# Patient Record
Sex: Male | Born: 1965 | Race: Black or African American | Hispanic: No | Marital: Married | Smoking: Never smoker
Health system: Southern US, Community
[De-identification: ages and names within clinical notes are randomized; demographics above are authoritative.]

## PROBLEM LIST (undated history)

## (undated) ENCOUNTER — Ambulatory Visit (HOSPITAL_COMMUNITY): Discharge status: Absent without leave | Payer: Self-pay

## (undated) DIAGNOSIS — I10 Essential (primary) hypertension: Secondary | ICD-10-CM

## (undated) DIAGNOSIS — E7800 Pure hypercholesterolemia, unspecified: Secondary | ICD-10-CM

## (undated) DIAGNOSIS — E119 Type 2 diabetes mellitus without complications: Secondary | ICD-10-CM

---

## 2000-05-15 ENCOUNTER — Ambulatory Visit (HOSPITAL_BASED_OUTPATIENT_CLINIC_OR_DEPARTMENT_OTHER): Admission: RE | Admit: 2000-05-15 | Discharge: 2000-05-15 | Payer: Self-pay | Admitting: Ophthalmology

## 2001-12-17 ENCOUNTER — Emergency Department (HOSPITAL_COMMUNITY): Admission: EM | Admit: 2001-12-17 | Discharge: 2001-12-17 | Payer: Self-pay | Admitting: Emergency Medicine

## 2002-02-22 ENCOUNTER — Encounter: Payer: Self-pay | Admitting: Emergency Medicine

## 2002-02-22 ENCOUNTER — Emergency Department (HOSPITAL_COMMUNITY): Admission: EM | Admit: 2002-02-22 | Discharge: 2002-02-22 | Payer: Self-pay | Admitting: Emergency Medicine

## 2003-01-18 ENCOUNTER — Emergency Department (HOSPITAL_COMMUNITY): Admission: EM | Admit: 2003-01-18 | Discharge: 2003-01-18 | Payer: Self-pay | Admitting: *Deleted

## 2003-07-01 ENCOUNTER — Emergency Department (HOSPITAL_COMMUNITY): Admission: EM | Admit: 2003-07-01 | Discharge: 2003-07-02 | Payer: Self-pay | Admitting: Emergency Medicine

## 2004-05-24 ENCOUNTER — Emergency Department (HOSPITAL_COMMUNITY): Admission: EM | Admit: 2004-05-24 | Discharge: 2004-05-24 | Payer: Self-pay | Admitting: Emergency Medicine

## 2004-06-13 ENCOUNTER — Emergency Department (HOSPITAL_COMMUNITY): Admission: EM | Admit: 2004-06-13 | Discharge: 2004-06-13 | Payer: Self-pay | Admitting: Emergency Medicine

## 2004-07-06 ENCOUNTER — Emergency Department (HOSPITAL_COMMUNITY): Admission: AD | Admit: 2004-07-06 | Discharge: 2004-07-06 | Payer: Self-pay | Admitting: Emergency Medicine

## 2004-10-18 ENCOUNTER — Emergency Department (HOSPITAL_COMMUNITY): Admission: EM | Admit: 2004-10-18 | Discharge: 2004-10-18 | Payer: Self-pay | Admitting: Family Medicine

## 2005-03-19 ENCOUNTER — Emergency Department (HOSPITAL_COMMUNITY): Admission: EM | Admit: 2005-03-19 | Discharge: 2005-03-19 | Payer: Self-pay | Admitting: Family Medicine

## 2005-06-28 ENCOUNTER — Emergency Department (HOSPITAL_COMMUNITY): Admission: EM | Admit: 2005-06-28 | Discharge: 2005-06-28 | Payer: Self-pay | Admitting: Family Medicine

## 2005-10-15 ENCOUNTER — Emergency Department (HOSPITAL_COMMUNITY): Admission: EM | Admit: 2005-10-15 | Discharge: 2005-10-15 | Payer: Self-pay | Admitting: Family Medicine

## 2005-11-16 ENCOUNTER — Emergency Department (HOSPITAL_COMMUNITY): Admission: EM | Admit: 2005-11-16 | Discharge: 2005-11-16 | Payer: Self-pay | Admitting: Family Medicine

## 2006-01-31 ENCOUNTER — Emergency Department (HOSPITAL_COMMUNITY): Admission: AD | Admit: 2006-01-31 | Discharge: 2006-01-31 | Payer: Self-pay | Admitting: Emergency Medicine

## 2006-02-03 ENCOUNTER — Emergency Department (HOSPITAL_COMMUNITY): Admission: EM | Admit: 2006-02-03 | Discharge: 2006-02-03 | Payer: Self-pay | Admitting: Family Medicine

## 2006-03-25 ENCOUNTER — Emergency Department (HOSPITAL_COMMUNITY): Admission: EM | Admit: 2006-03-25 | Discharge: 2006-03-25 | Payer: Self-pay | Admitting: Family Medicine

## 2006-06-27 ENCOUNTER — Emergency Department (HOSPITAL_COMMUNITY): Admission: EM | Admit: 2006-06-27 | Discharge: 2006-06-27 | Payer: Self-pay | Admitting: Family Medicine

## 2006-06-29 ENCOUNTER — Emergency Department (HOSPITAL_COMMUNITY): Admission: EM | Admit: 2006-06-29 | Discharge: 2006-06-29 | Payer: Self-pay | Admitting: Emergency Medicine

## 2006-12-13 ENCOUNTER — Emergency Department (HOSPITAL_COMMUNITY): Admission: EM | Admit: 2006-12-13 | Discharge: 2006-12-13 | Payer: Self-pay | Admitting: Family Medicine

## 2006-12-16 ENCOUNTER — Emergency Department (HOSPITAL_COMMUNITY): Admission: EM | Admit: 2006-12-16 | Discharge: 2006-12-16 | Payer: Self-pay | Admitting: Emergency Medicine

## 2007-03-05 ENCOUNTER — Emergency Department (HOSPITAL_COMMUNITY): Admission: EM | Admit: 2007-03-05 | Discharge: 2007-03-05 | Payer: Self-pay | Admitting: Family Medicine

## 2007-05-13 ENCOUNTER — Emergency Department (HOSPITAL_COMMUNITY): Admission: EM | Admit: 2007-05-13 | Discharge: 2007-05-13 | Payer: Self-pay | Admitting: Emergency Medicine

## 2007-05-31 ENCOUNTER — Emergency Department (HOSPITAL_COMMUNITY): Admission: EM | Admit: 2007-05-31 | Discharge: 2007-05-31 | Payer: Self-pay | Admitting: Emergency Medicine

## 2007-09-03 ENCOUNTER — Emergency Department (HOSPITAL_COMMUNITY): Admission: EM | Admit: 2007-09-03 | Discharge: 2007-09-03 | Payer: Self-pay | Admitting: Emergency Medicine

## 2007-11-17 ENCOUNTER — Emergency Department (HOSPITAL_COMMUNITY): Admission: EM | Admit: 2007-11-17 | Discharge: 2007-11-17 | Payer: Self-pay | Admitting: Family Medicine

## 2008-02-07 ENCOUNTER — Emergency Department (HOSPITAL_COMMUNITY): Admission: EM | Admit: 2008-02-07 | Discharge: 2008-02-07 | Payer: Self-pay | Admitting: Emergency Medicine

## 2008-02-21 ENCOUNTER — Emergency Department (HOSPITAL_COMMUNITY): Admission: EM | Admit: 2008-02-21 | Discharge: 2008-02-21 | Payer: Self-pay | Admitting: Family Medicine

## 2008-04-17 ENCOUNTER — Emergency Department (HOSPITAL_COMMUNITY): Admission: EM | Admit: 2008-04-17 | Discharge: 2008-04-17 | Payer: Self-pay | Admitting: Family Medicine

## 2008-07-31 ENCOUNTER — Emergency Department (HOSPITAL_COMMUNITY): Admission: EM | Admit: 2008-07-31 | Discharge: 2008-08-01 | Payer: Self-pay | Admitting: Emergency Medicine

## 2008-09-18 ENCOUNTER — Emergency Department (HOSPITAL_COMMUNITY): Admission: EM | Admit: 2008-09-18 | Discharge: 2008-09-18 | Payer: Self-pay | Admitting: Emergency Medicine

## 2008-12-04 ENCOUNTER — Emergency Department (HOSPITAL_COMMUNITY): Admission: EM | Admit: 2008-12-04 | Discharge: 2008-12-04 | Payer: Self-pay | Admitting: Emergency Medicine

## 2009-04-04 ENCOUNTER — Emergency Department (HOSPITAL_COMMUNITY): Admission: EM | Admit: 2009-04-04 | Discharge: 2009-04-04 | Payer: Self-pay | Admitting: Emergency Medicine

## 2009-07-30 ENCOUNTER — Emergency Department (HOSPITAL_COMMUNITY): Admission: EM | Admit: 2009-07-30 | Discharge: 2009-07-30 | Payer: Self-pay | Admitting: Family Medicine

## 2009-09-13 ENCOUNTER — Emergency Department (HOSPITAL_COMMUNITY): Admission: EM | Admit: 2009-09-13 | Discharge: 2009-09-13 | Payer: Self-pay | Admitting: Emergency Medicine

## 2009-12-01 ENCOUNTER — Emergency Department (HOSPITAL_COMMUNITY): Admission: EM | Admit: 2009-12-01 | Discharge: 2009-12-01 | Payer: Self-pay | Admitting: Family Medicine

## 2009-12-10 ENCOUNTER — Emergency Department (HOSPITAL_COMMUNITY): Admission: EM | Admit: 2009-12-10 | Discharge: 2009-12-10 | Payer: Self-pay | Admitting: Emergency Medicine

## 2010-01-03 ENCOUNTER — Emergency Department (HOSPITAL_COMMUNITY): Admission: EM | Admit: 2010-01-03 | Discharge: 2010-01-03 | Payer: Self-pay | Admitting: Emergency Medicine

## 2010-02-24 ENCOUNTER — Emergency Department (HOSPITAL_COMMUNITY): Admission: EM | Admit: 2010-02-24 | Discharge: 2010-02-24 | Payer: Self-pay | Admitting: Emergency Medicine

## 2010-04-02 ENCOUNTER — Ambulatory Visit: Payer: Self-pay | Admitting: Internal Medicine

## 2010-04-02 ENCOUNTER — Encounter (INDEPENDENT_AMBULATORY_CARE_PROVIDER_SITE_OTHER): Payer: Self-pay | Admitting: Family Medicine

## 2010-04-02 LAB — CONVERTED CEMR LAB
ALT: 35 units/L (ref 0–53)
Basophils Absolute: 0 10*3/uL (ref 0.0–0.1)
Basophils Relative: 0 % (ref 0–1)
Calcium: 10.1 mg/dL (ref 8.4–10.5)
Chloride: 96 meq/L (ref 96–112)
Eosinophils Absolute: 0 10*3/uL (ref 0.0–0.7)
Eosinophils Relative: 1 % (ref 0–5)
HCT: 44.5 % (ref 39.0–52.0)
Lymphocytes Relative: 63 % — ABNORMAL HIGH (ref 12–46)
Lymphs Abs: 3.2 10*3/uL (ref 0.7–4.0)
MCV: 80.9 fL (ref 78.0–100.0)
Monocytes Absolute: 0.3 10*3/uL (ref 0.1–1.0)
Neutro Abs: 1.6 10*3/uL — ABNORMAL LOW (ref 1.7–7.7)
Sodium: 133 meq/L — ABNORMAL LOW (ref 135–145)
Testosterone: 224.24 ng/dL — ABNORMAL LOW (ref 350–890)
Total Bilirubin: 0.3 mg/dL (ref 0.3–1.2)
Total Protein: 8.2 g/dL (ref 6.0–8.3)

## 2010-04-03 ENCOUNTER — Encounter (INDEPENDENT_AMBULATORY_CARE_PROVIDER_SITE_OTHER): Payer: Self-pay | Admitting: Family Medicine

## 2010-05-08 ENCOUNTER — Ambulatory Visit: Payer: Self-pay | Admitting: Internal Medicine

## 2010-08-04 ENCOUNTER — Emergency Department (HOSPITAL_COMMUNITY)
Admission: EM | Admit: 2010-08-04 | Discharge: 2010-08-05 | Payer: Self-pay | Source: Home / Self Care | Admitting: Emergency Medicine

## 2010-12-13 ENCOUNTER — Encounter (INDEPENDENT_AMBULATORY_CARE_PROVIDER_SITE_OTHER): Payer: Self-pay | Admitting: *Deleted

## 2010-12-13 LAB — CONVERTED CEMR LAB: Microalb, Ur: 1.61 mg/dL (ref 0.00–1.89)

## 2011-01-01 LAB — POCT I-STAT, CHEM 8
Chloride: 103 mEq/L (ref 96–112)
Glucose, Bld: 155 mg/dL — ABNORMAL HIGH (ref 70–99)
HCT: 43 % (ref 39.0–52.0)
Hemoglobin: 14.6 g/dL (ref 13.0–17.0)
Sodium: 140 mEq/L (ref 135–145)

## 2011-01-10 LAB — URINALYSIS, ROUTINE W REFLEX MICROSCOPIC
Bilirubin Urine: NEGATIVE
Hgb urine dipstick: NEGATIVE
Ketones, ur: NEGATIVE mg/dL
Protein, ur: NEGATIVE mg/dL
Specific Gravity, Urine: 1.009 (ref 1.005–1.030)
pH: 5.5 (ref 5.0–8.0)

## 2011-01-10 LAB — GLUCOSE, CAPILLARY: Glucose-Capillary: 205 mg/dL — ABNORMAL HIGH (ref 70–99)

## 2011-01-23 LAB — GLUCOSE, CAPILLARY: Glucose-Capillary: 354 mg/dL — ABNORMAL HIGH (ref 70–99)

## 2011-02-04 LAB — GLUCOSE, CAPILLARY: Glucose-Capillary: 103 mg/dL — ABNORMAL HIGH (ref 70–99)

## 2011-02-09 ENCOUNTER — Inpatient Hospital Stay (INDEPENDENT_AMBULATORY_CARE_PROVIDER_SITE_OTHER)
Admission: RE | Admit: 2011-02-09 | Discharge: 2011-02-09 | Disposition: A | Discharge status: Home / Self Care | Payer: Self-pay | Source: Ambulatory Visit | Attending: Family Medicine | Admitting: Family Medicine

## 2011-02-09 DIAGNOSIS — R7989 Other specified abnormal findings of blood chemistry: Secondary | ICD-10-CM

## 2011-02-09 DIAGNOSIS — H9209 Otalgia, unspecified ear: Secondary | ICD-10-CM

## 2011-02-09 LAB — GLUCOSE, CAPILLARY: Glucose-Capillary: 268 mg/dL — ABNORMAL HIGH (ref 70–99)

## 2011-03-07 NOTE — Op Note (Signed)
[place]  Patient:    Chase Mccormick, Chase Mccormick                MRN: [id number] Proc. Date: 05/15/00 Adm. Date:  [id number] Attending:  Shara Blazing                           Operative Report  PREOPERATIVE DIAGNOSES: 1. Exotropia. 2. Optic atrophy, right eye.  POSTOPERATIVE DIAGNOSES: 1. Exotropia. 2. Optic atrophy, right eye.  PROCEDURES: 1. Right lateral rectus muscle recession, 8.0 mm. 2. Right medial rectus muscle resection, 7.0 mm.  SURGEON:  Pasty Spillers. Maple Hudson, M.D.  ANESTHESIA:  General (laryngeal mask).  COMPLICATIONS:  None.  DESCRIPTION OF PROCEDURE:  After routine preoperative evaluation, including informed consent, the patient was taken to the operating room, where he was identified by me.  General anesthesia was induced without difficulty, after placement of appropriate monitors.   The patient was prepped and draped in standard sterile fashion.  A lid speculum was placed in the right eye.  Through an inferotemporal fornix incision through conjunctiva and Tenons fascia, the right lateral rectus muscle was engaged on a series of muscle hooks, and carefully cleared of its surrounding fascial attachments.  The tendon was secured with a double-armed 6-0 Vicryl suture, with a double-locking bite at each border of the tendon.  The muscle was disinserted from the globe using Westcott scissors and was reattached to sclera a measured distance of 8.0 mm posterior to the unoperated insertion, using direct scleral passes in cross-swords fashion.   The suture ends were tied securely after the position of the muscle had been checked and found to be accurate.  Conjunctiva was closed with two interrupted 6-0 plain gut sutures.  Through an inferonasal fornix incision through conjunctiva and Tenons fascia, the right medial rectus muscle was engaged on a series of muscle hooks, and carefully cleared of its surrounding fascial attachments.  The  muscle was spread between two self-retaining hooks.  A 2 mm bite of the center of the muscle belly was taken at a measured distance of 7.0 mm posterior to the insertion and a secure knot was placed at this location.  Each pole suture was passed from the center of the muscle belly to the periphery, 7 mm posterior to and parallel to the insertion.  A double locking bite was placed at each border of the muscle.  A resection clamp was placed on the muscle just anterior to these sutures.  The muscle was disinserted from the globe using Westcott scissors.  Each pole suture was passed back through the original stump posteriorly to anteriorly through the periphery of the stump and then anteriorly to posteriorly near the center of the stump and then posteriorly to anteriorly through the center of the muscle belly, just posterior to the previously placed knots.  The muscle was drawn up to the level of the original insertion and all slack was removed from the sutures, which were tied securely.  The clamp was removed.  The portion of the muscle anterior to the sutures was carefully excised.  Conjunctiva was closed with a single interrupted 6-0 plain gut suture.  Tobradex ointment was placed in the eye. The patient was awakened without difficulty and taken to the recovery room in stable condition, having suffered no intraoperative or immediate postoperative complications. DD:  05/15/00 TD:  05/16/00 Job: [id number]

## 2011-07-10 LAB — INFLUENZA A AND B ANTIGEN (CONVERTED LAB): Inflenza A Ag: NEGATIVE

## 2011-07-21 LAB — BASIC METABOLIC PANEL
BUN: 9
Calcium: 9.4
Creatinine, Ser: 0.76
GFR calc non Af Amer: >60
Sodium: 135

## 2011-07-21 LAB — DIFFERENTIAL
Basophils Relative: 0
Eosinophils Absolute: 0
Eosinophils Relative: 1
Lymphs Abs: 2
Monocytes Absolute: 0.3
Monocytes Relative: 6
Neutrophils Relative %: 50

## 2011-07-21 LAB — CBC
HCT: 43.8
MCV: 81.8
WBC: 4.6

## 2011-09-08 ENCOUNTER — Encounter: Payer: Self-pay | Admitting: *Deleted

## 2011-09-08 ENCOUNTER — Emergency Department (INDEPENDENT_AMBULATORY_CARE_PROVIDER_SITE_OTHER)
Admission: EM | Admit: 2011-09-08 | Discharge: 2011-09-08 | Disposition: A | Discharge status: Home / Self Care | Payer: Self-pay | Source: Home / Self Care | Attending: Family Medicine | Admitting: Family Medicine

## 2011-09-08 DIAGNOSIS — H109 Unspecified conjunctivitis: Secondary | ICD-10-CM

## 2011-09-08 MED ORDER — TOBRAMYCIN 0.3 % OP SOLN: 1.0000 [drp] | OPHTHALMIC | Status: AC

## 2011-09-08 NOTE — ED Notes (Signed)
Patient given discharge instructions by Dr Artis Flock, expressed understanding and had all questions answered.  Pt given RX x 1

## 2011-09-08 NOTE — ED Provider Notes (Signed)
History     CSN: [id number] Arrival date & time: 09/08/2011  1:42 PM   First MD Initiated Contact with Patient 09/08/11 1409      Chief Complaint  Patient presents with  . Eye Problem    (Consider location/radiation/quality/duration/timing/severity/associated sxs/prior treatment) Patient is a 45 y.o. male presenting with eye problem. The history is provided by the patient.  Eye Problem  This is a new problem. The current episode started more than 1 week ago. The problem occurs every several days. The problem has not changed since onset.There is pain in the left eye. There was no injury mechanism. The pain is mild. There is no history of trauma to the eye. Pertinent negatives include no blurred vision, no decreased vision, no discharge, no double vision, no photophobia and no eye redness.    Past Medical History  Diagnosis Date  . Diabetes mellitus     History reviewed. No pertinent past surgical history.  Family History  Problem Relation Age of Onset  . Ulcers Mother     History  Substance Use Topics  . Smoking status: Never Smoker   . Smokeless tobacco: Not on file  . Alcohol Use: No      Review of Systems  Constitutional: Negative.   HENT: Negative.   Eyes: Negative for blurred vision, double vision, photophobia, discharge and redness.    Allergies  Review of patient's allergies indicates no known allergies.  Home Medications   Current Outpatient Rx  Name Route Sig Dispense Refill  . GLIPIZIDE 5 MG PO TABS Oral Take 5 mg by mouth 2 (two) times daily before a meal.      . METFORMIN HCL 500 MG PO TABS Oral Take 500 mg by mouth 2 (two) times daily with a meal.      . NIACIN-LOVASTATIN 1000-20 MG PO TB24 Oral Take 1 tablet by mouth at bedtime.        BP 137/90  Pulse 64  Temp(Src) 98.2 F (36.8 C) (Oral)  Resp 16  SpO2 99%  Physical Exam  Nursing note and vitals reviewed. Constitutional: He appears well-developed and well-nourished.  HENT:  Head:  Normocephalic.  Right Ear: External ear normal.  Left Ear: External ear normal.  Nose: Nose normal.  Mouth/Throat: Oropharynx is clear and moist.  Eyes: Conjunctivae and EOM are normal. Pupils are equal, round, and reactive to light. Right eye exhibits no discharge. Left eye exhibits discharge.  Fundoscopic exam:      The right eye shows no arteriolar narrowing, no AV nicking, no exudate and no hemorrhage.       The left eye shows no arteriolar narrowing, no AV nicking, no exudate and no hemorrhage.    ED Course  Procedures (including critical care time)  Labs Reviewed  GLUCOSE, CAPILLARY - Abnormal; Notable for the following:    Glucose-Capillary 238 (*)    All other components within normal limits   No results found.   No diagnosis found.    MDM          Barkley Bruns, MD 09/08/11 [phone]

## 2011-09-08 NOTE — ED Notes (Signed)
Pt  Reports  intermittant      l  Eye  Pain    X   sev months   denys   Any  specefic  Injury    Pt  Reports    occasonal      Drainage  From the  Affected  Eye but  Not  Now

## 2011-11-15 ENCOUNTER — Emergency Department (HOSPITAL_COMMUNITY)
Admission: EM | Admit: 2011-11-15 | Discharge: 2011-11-15 | Disposition: A | Discharge status: Home / Self Care | Payer: Self-pay | Attending: Emergency Medicine | Admitting: Emergency Medicine

## 2011-11-15 ENCOUNTER — Encounter (HOSPITAL_COMMUNITY): Payer: Self-pay | Admitting: Emergency Medicine

## 2011-11-15 ENCOUNTER — Emergency Department (HOSPITAL_COMMUNITY): Payer: Self-pay

## 2011-11-15 DIAGNOSIS — I10 Essential (primary) hypertension: Secondary | ICD-10-CM | POA: Insufficient documentation

## 2011-11-15 DIAGNOSIS — R1011 Right upper quadrant pain: Secondary | ICD-10-CM | POA: Insufficient documentation

## 2011-11-15 DIAGNOSIS — Z79899 Other long term (current) drug therapy: Secondary | ICD-10-CM | POA: Insufficient documentation

## 2011-11-15 DIAGNOSIS — R197 Diarrhea, unspecified: Secondary | ICD-10-CM | POA: Insufficient documentation

## 2011-11-15 DIAGNOSIS — K859 Acute pancreatitis without necrosis or infection, unspecified: Secondary | ICD-10-CM | POA: Insufficient documentation

## 2011-11-15 DIAGNOSIS — E7800 Pure hypercholesterolemia, unspecified: Secondary | ICD-10-CM | POA: Insufficient documentation

## 2011-11-15 DIAGNOSIS — E119 Type 2 diabetes mellitus without complications: Secondary | ICD-10-CM | POA: Insufficient documentation

## 2011-11-15 DIAGNOSIS — R509 Fever, unspecified: Secondary | ICD-10-CM | POA: Insufficient documentation

## 2011-11-15 DIAGNOSIS — R110 Nausea: Secondary | ICD-10-CM | POA: Insufficient documentation

## 2011-11-15 HISTORY — DX: Essential (primary) hypertension: I10

## 2011-11-15 HISTORY — DX: Pure hypercholesterolemia, unspecified: E78.00

## 2011-11-15 LAB — COMPREHENSIVE METABOLIC PANEL
ALT: 47 U/L (ref 0–53)
Alkaline Phosphatase: 92 U/L (ref 39–117)
BUN: 10 mg/dL (ref 6–23)
CO2: 23 mEq/L (ref 19–32)
Calcium: 8.9 mg/dL (ref 8.4–10.5)
GFR calc Af Amer: >90 mL/min (ref >90)
GFR calc non Af Amer: >90 mL/min (ref >90)
Glucose, Bld: 256 mg/dL — ABNORMAL HIGH (ref 70–99)
Total Protein: 7.3 g/dL (ref 6.0–8.3)

## 2011-11-15 LAB — LIPASE, BLOOD: Lipase: 214 U/L — ABNORMAL HIGH (ref 11–59)

## 2011-11-15 LAB — CBC
HCT: 41.5 % (ref 39.0–52.0)
Hemoglobin: 14.6 g/dL (ref 13.0–17.0)
MCH: 28.3 pg (ref 26.0–34.0)
MCHC: 35.2 g/dL (ref 30.0–36.0)
MCV: 80.4 fL (ref 78.0–100.0)
RBC: 5.16 MIL/uL (ref 4.22–5.81)

## 2011-11-15 LAB — DIFFERENTIAL
Basophils Absolute: 0 10*3/uL (ref 0.0–0.1)
Basophils Relative: 0 % (ref 0–1)
Eosinophils Relative: 2 % (ref 0–5)
Lymphocytes Relative: 23 % (ref 12–46)
Monocytes Absolute: 0.5 10*3/uL (ref 0.1–1.0)
Neutro Abs: 4.1 10*3/uL (ref 1.7–7.7)

## 2011-11-15 MED ORDER — HYDROCODONE-ACETAMINOPHEN 5-325 MG PO TABS: 1.0000 | ORAL_TABLET | ORAL | Status: AC | PRN

## 2011-11-15 MED ORDER — FAMOTIDINE 10 MG PO TABS: 10.0000 mg | ORAL_TABLET | Freq: Two times a day (BID) | ORAL | Status: DC

## 2011-11-15 MED ORDER — PROMETHAZINE HCL 25 MG PO TABS: 25.0000 mg | ORAL_TABLET | Freq: Four times a day (QID) | ORAL | Status: AC | PRN

## 2011-11-15 NOTE — ED Notes (Signed)
PO fluids given to patient for PO challenge.

## 2011-11-15 NOTE — ED Provider Notes (Signed)
Medical screening examination/treatment/procedure(s) were performed by non-physician practitioner and as supervising physician I was immediately available for consultation/collaboration.   Lyanne Co, MD 11/15/11 [phone]

## 2011-11-15 NOTE — ED Provider Notes (Signed)
History     CSN: [id number]  Arrival date & time 11/15/11  1229   First MD Initiated Contact with Patient 11/15/11 1253      Chief Complaint  Patient presents with  . Abdominal Pain  . Fever    (Consider location/radiation/quality/duration/timing/severity/associated sxs/prior treatment) HPI History provided by pt.   Pt has had post-prandial, diffuse abdominal pain, worst in RUQ, w/ associated nausea, diarrhea and subjective fever for the past three days.  Non-positional. Denies CP, SOB, blood in stool, urinary sx.  No past abdominal surgeries.  Has not had alcohol in 6 months.  Has been eating a lot of spicy food over the past few days.    Past Medical History  Diagnosis Date  . Diabetes mellitus   . Hypertension   . High cholesterol     History reviewed. No pertinent past surgical history.  Family History  Problem Relation Age of Onset  . Ulcers Mother     History  Substance Use Topics  . Smoking status: Never Smoker   . Smokeless tobacco: Not on file  . Alcohol Use: No      Review of Systems  All other systems reviewed and are negative.    Allergies  Review of patient's allergies indicates no known allergies.  Home Medications   Current Outpatient Rx  Name Route Sig Dispense Refill  . ATORVASTATIN CALCIUM 20 MG PO TABS Oral Take 20 mg by mouth at bedtime.    Marland Kitchen GLIPIZIDE 5 MG PO TABS Oral Take 5 mg by mouth 2 (two) times daily before a meal.      . LISINOPRIL 10 MG PO TABS Oral Take 10 mg by mouth daily.    Marland Kitchen METFORMIN HCL 500 MG PO TABS Oral Take 500 mg by mouth 2 (two) times daily with a meal.      . NIACIN-LOVASTATIN ER 1000-20 MG PO TB24 Oral Take 1 tablet by mouth at bedtime.        BP 129/74  Pulse 77  Temp(Src) 97.6 F (36.4 C) (Oral)  Resp 18  SpO2 95%  Physical Exam  Nursing note and vitals reviewed. Constitutional: He is oriented to person, place, and time. He appears well-developed and well-nourished. No distress.  HENT:  Head:  Normocephalic and atraumatic.  Eyes:       Normal appearance  Neck: Normal range of motion.  Cardiovascular: Normal rate and regular rhythm.   Pulmonary/Chest: Effort normal and breath sounds normal. He exhibits no tenderness.  Abdominal: Soft. Bowel sounds are normal. He exhibits no distension and no mass. There is no rebound and no guarding.       Mild tenderness across upper abdomen.    Neurological: He is alert and oriented to person, place, and time.  Skin: Skin is warm and dry. No rash noted.  Psychiatric: He has a normal mood and affect. His behavior is normal.    ED Course  Procedures (including critical care time)  Labs Reviewed  COMPREHENSIVE METABOLIC PANEL - Abnormal; Notable for the following:    Glucose, Bld 256 (*)    Creatinine, Ser 0.48 (*)    All other components within normal limits  LIPASE, BLOOD - Abnormal; Notable for the following:    Lipase 214 (*)    All other components within normal limits  CBC  DIFFERENTIAL  DIFFERENTIAL  URINALYSIS, ROUTINE W REFLEX MICROSCOPIC   US Abdomen Complete  11/15/2011  *RADIOLOGY REPORT*  Clinical Data:  Abdominal pain.  Elevated lipase.  ABDOMINAL ULTRASOUND  COMPLETE  Comparison:  None.  Findings: Diminished exam detail due to overlying bowel gas.  Gallbladder:  No gallstones, gallbladder wall thickening, or pericholecystic fluid.  Common Bile Duct:  Within normal limits in caliber.  Liver: Diffuse fatty infiltration of the liver.  No focal mass lesion identified.  IVC:  Appears normal.  Pancreas:  No abnormality identified.  Spleen:  Within normal limits in size and echotexture.  Right kidney:  Normal in size and parenchymal echogenicity.  No evidence of mass or hydronephrosis.  Left kidney:  Normal in size and parenchymal echogenicity.  No evidence of mass or hydronephrosis.  Abdominal Aorta:  No aneurysm identified.  IMPRESSION: 1.  No acute findings. 2. Fatty infiltration of the liver.  Original Report Authenticated By: Rosealee Albee, M.D.     1. Pancreatitis       MDM  Pt presents w/ post-prandial abd pain/nausea as well as diarrhea and subjective fever x 3 days.  Exam sig for diffuse upper abd ttp; pt points to RUQ as being most painful location.  Has not had pain since attempting to eat breakfast this am and declines pain medication at this time.  Suspect cholelithiasis vs. gastritis.  EKG ordered d/t comorbidities and labs and [place] abd pending.  1:37 PM   Labs sig for hyperglycemia w/out acidosis/anion gap and elevated lipase.  [place] neg for gallstones.  Pt continues to be pain free and is tolerating po fluids.  I recommended liquid diet for the next 24-48 hours and f/u with both his PCP and GI.  Prescribed vicodin, promethazine and pepcid (possible gastritis as well).  Strict return precautions discussed.        Arie Sabina Bro[place]us, [place] 11/15/11 1539

## 2011-11-15 NOTE — ED Notes (Signed)
Reports having abd pain when he eats and now having fever. No acute distress noted at triage.

## 2011-11-15 NOTE — ED Notes (Signed)
Patient is resting comfortably. Patient declined need for pain medication at this time.

## 2012-01-18 ENCOUNTER — Emergency Department (HOSPITAL_COMMUNITY)
Admission: EM | Admit: 2012-01-18 | Discharge: 2012-01-18 | Discharge status: Against Medical Advice | Payer: Self-pay | Attending: Emergency Medicine | Admitting: Emergency Medicine

## 2012-01-18 ENCOUNTER — Encounter (HOSPITAL_COMMUNITY): Payer: Self-pay | Admitting: *Deleted

## 2012-01-18 DIAGNOSIS — I10 Essential (primary) hypertension: Secondary | ICD-10-CM | POA: Insufficient documentation

## 2012-01-18 DIAGNOSIS — L738 Other specified follicular disorders: Secondary | ICD-10-CM | POA: Insufficient documentation

## 2012-01-18 DIAGNOSIS — E7800 Pure hypercholesterolemia, unspecified: Secondary | ICD-10-CM | POA: Insufficient documentation

## 2012-01-18 DIAGNOSIS — L739 Follicular disorder, unspecified: Secondary | ICD-10-CM

## 2012-01-18 DIAGNOSIS — E119 Type 2 diabetes mellitus without complications: Secondary | ICD-10-CM | POA: Insufficient documentation

## 2012-01-18 LAB — GLUCOSE, CAPILLARY: Glucose-Capillary: 464 mg/dL — ABNORMAL HIGH (ref 70–99)

## 2012-01-18 MED ORDER — SULFAMETHOXAZOLE-TMP DS 800-160 MG PO TABS
1.0000 | ORAL_TABLET | Freq: Once | ORAL | Status: AC
  Administered 2012-01-18: 1 via ORAL
  Filled 2012-01-18: qty 1

## 2012-01-18 MED ORDER — SULFAMETHOXAZOLE-TMP DS 800-160 MG PO TABS: 1.0000 | ORAL_TABLET | Freq: Two times a day (BID) | ORAL | Status: AC

## 2012-01-18 NOTE — ED Provider Notes (Signed)
History     CSN: [id number]  Arrival date & time 01/18/12  1645   First MD Initiated Contact with Patient 01/18/12 1727      Chief Complaint  Patient presents with  . infected hair folicles     (Consider location/radiation/quality/duration/timing/severity/associated sxs/prior treatment) HPI Comments: Patient here with pustules to pubic area after having shaved his pubic hair 3-4 days ago - states no fever, chills, purulent drainage from the areas if he squeezes them.  Patient is a 46 y.o. male presenting with rash. The history is provided by the patient. No language interpreter was used.  Rash  This is a new problem. The current episode started more than 2 days ago. The problem has been gradually worsening. Associated with: shaving. There has been no fever. The rash is present on the groin. The pain is at a severity of 4/10. The pain is moderate. The pain has been constant since onset. Associated symptoms include itching and pain. Pertinent negatives include no blisters and no weeping. He has tried nothing for the symptoms.    Past Medical History  Diagnosis Date  . Diabetes mellitus   . Hypertension   . High cholesterol     History reviewed. No pertinent past surgical history.  Family History  Problem Relation Age of Onset  . Ulcers Mother     History  Substance Use Topics  . Smoking status: Never Smoker   . Smokeless tobacco: Not on file  . Alcohol Use: No      Review of Systems  Skin: Positive for itching and rash.  All other systems reviewed and are negative.    Allergies  Review of patient's allergies indicates no known allergies.  Home Medications  No current outpatient prescriptions on file.  BP 144/83  Pulse 81  Temp(Src) 97.9 F (36.6 C) (Oral)  Resp 20  SpO2 98%  Physical Exam  Nursing note and vitals reviewed. Constitutional: He is oriented to person, place, and time. He appears well-developed and well-nourished. No distress.  HENT:    Head: Normocephalic and atraumatic.  Right Ear: External ear normal.  Left Ear: External ear normal.  Nose: Nose normal.  Mouth/Throat: Oropharynx is clear and moist. No oropharyngeal exudate.  Eyes: Conjunctivae are normal. Pupils are equal, round, and reactive to light. No scleral icterus.  Neck: Normal range of motion. Neck supple.  Cardiovascular: Normal rate, regular rhythm and normal heart sounds.  Exam reveals no gallop and no friction rub.   No murmur heard. Pulmonary/Chest: Effort normal and breath sounds normal. He exhibits no tenderness.  Abdominal: Soft. Bowel sounds are normal. He exhibits no distension. There is no tenderness.  Musculoskeletal: Normal range of motion. He exhibits no edema and no tenderness.  Lymphadenopathy:    He has no cervical adenopathy.  Neurological: He is alert and oriented to person, place, and time. No cranial nerve deficit.  Skin: Skin is warm and dry. Rash noted. There is erythema.       Diffuse pustules to hair follicles to pubic area.  Psychiatric: He has a normal mood and affect. His behavior is normal. Judgment and thought content normal.    ED Course  Procedures (including critical care time)  Labs Reviewed  GLUCOSE, CAPILLARY - Abnormal; Notable for the following:    Glucose-Capillary 464 (*)    All other components within normal limits   No results found.   Folliculitis    MDM  Patient here with folliculitis - he asked me to check his blood  sugar which was noted to be quite high at 464 - I would like to do further testing and give the patient insulin to get the sugar down but he does not wish to stay.  He will sign out AMA for this - though possible, I do not clinically suspect that the patient is in DKA, will give him the prescription for the antibiotic for the folliculitis - he reports that he has diabetes medication at home and he will resume taking this.        Izola Price [place], [place] 01/18/12 [phone]

## 2012-01-18 NOTE — ED Notes (Signed)
Pt reports recently shaving his genitalia area and now has white bumps to the area.

## 2012-01-18 NOTE — Discharge Instructions (Signed)
Blood Sugar Monitoring, Adult GLUCOSE METERS FOR SELF-MONITORING OF BLOOD GLUCOSE  It is important to be able to correctly measure your blood sugar (glucose). You can use a blood glucose monitor (a small battery-operated device) to check your glucose level at any time. This allows you and your caregiver to monitor your diabetes and to determine how well your treatment plan is working. The process of monitoring your blood glucose with a glucose meter is called self-monitoring of blood glucose (SMBG). When people with diabetes control their blood sugar, they have better health. To test for glucose with a typical glucose meter, place the disposable strip in the meter. Then place a small sample of blood on the "test strip." The test strip is coated with chemicals that combine with glucose in blood. The meter measures how much glucose is present. The meter displays the glucose level as a number. Several new models can record and store a number of test results. Some models can connect to personal computers to store test results or print them out.  Newer meters are often easier to use than older models. Some meters allow you to get blood from places other than your fingertip. Some new models have automatic timing, error codes, signals, or barcode readers to help with proper adjustment (calibration). Some meters have a large display screen or spoken instructions for people with visual impairments.  INSTRUCTIONS FOR USING GLUCOSE METERS  Wash your hands with soap and warm water, or clean the area with alcohol. Dry your hands completely.   Prick the side of your fingertip with a lancet (a sharp-pointed tool used by hand).   Hold the hand down and gently milk the finger until a small drop of blood appears. Catch the blood with the test strip.   Follow the instructions for inserting the test strip and using the SMBG meter. Most meters require the meter to be turned on and the test strip to be inserted before  applying the blood sample.   Record the test result.   Read the instructions carefully for both the meter and the test strips that go with it. Meter instructions are found in the user manual. Keep this manual to help you solve any problems that may arise. Many meters use "error codes" when there is a problem with the meter, the test strip, or the blood sample on the strip. You will need the manual to understand these error codes and fix the problem.   New devices are available such as laser lancets and meters that can test blood taken from "alternative sites" of the body, other than fingertips. However, you should use standard fingertip testing if your glucose changes rapidly. Also, use standard testing if:   You have eaten, exercised, or taken insulin in the past 2 hours.   You think your glucose is low.   You tend to not feel symptoms of low blood glucose (hypoglycemia).   You are ill or under stress.   Clean the meter as directed by the manufacturer.   Test the meter for accuracy as directed by the manufacturer.   Take your meter with you to your caregiver's office. This way, you can test your glucose in front of your caregiver to make sure you are using the meter correctly. Your caregiver can also take a sample of blood to test using a routine lab method. If values on the glucose meter are close to the lab results, you and your caregiver will see that your meter is working well  and you are using good technique. Your caregiver will advise you about what to do if the results do not match.  FREQUENCY OF TESTING  Your caregiver will tell you how often you should check your blood glucose. This will depend on your type of diabetes, your current level of diabetes control, and your types of medicines. The following are general guidelines, but your care plan may be different. Record all your readings and the time of day you took them for review with your caregiver.   Diabetes type 1.   When you  are using insulin with good diabetic control (either multiple daily injections or via a pump), you should check your glucose 4 times a day.   If your diabetes is not well controlled, you may need to monitor more frequently, including before meals and 2 hours after meals, at bedtime, and occasionally between 2 a.m. and 3 a.m.   You should always check your glucose before a dose of insulin or before changing the rate on your insulin pump.   Diabetes type 2.   Guidelines for SMBG in diabetes type 2 are not as well defined.   If you are on insulin, follow the guidelines above.   If you are on medicines, but not insulin, and your glucose is not well controlled, you should test at least twice daily.   If you are not on insulin, and your diabetes is controlled with medicines or diet alone, you should test at least once daily, usually before breakfast.   A weekly profile will help your caregiver advise you on your care plan. The week before your visit, check your glucose before a meal and 2 hours after a meal at least daily. You may want to test before and after a different meal each day so you and your caregiver can tell how well controlled your blood sugars are throughout the course of a 24 hour period.   Gestational diabetes (diabetes during pregnancy).   Frequent testing is often necessary. Accurate timing is important.   If you are not on insulin, check your glucose 4 times a day. Check it before breakfast and 1 hour after the start of each meal.   If you are on insulin, check your glucose 6 times a day. Check it before each meal and 1 hour after the first bite of each meal.   General guidelines.   More frequent testing is required at the start of insulin treatment. Your caregiver will instruct you.   Test your glucose any time you suspect you have low blood sugar (hypoglycemia).   You should test more often when you change medicines, when you have unusual stress or illness, or in other  unusual circumstances.  OTHER THINGS TO KNOW ABOUT GLUCOSE METERS  Measurement Range. Most glucose meters are able to read glucose levels over a broad range of values from as low as 0 to as high as 600 mg/dL. If you get an extremely high or low reading from your meter, you should first confirm it with another reading. Report very high or very low readings to your caregiver.   Whole Blood Glucose versus Plasma Glucose. Some older home glucose meters measure glucose in your whole blood. In a lab or when using some newer home glucose meters, the glucose is measured in your plasma (one component of blood). The difference can be important. It is important for you and your caregiver to know whether your meter gives its results as "whole blood equivalent" or "plasma  equivalent."   Display of High and Low Glucose Values. Part of learning how to operate a meter is understanding what the meter results mean. Know how high and low glucose concentrations are displayed on your meter.   Factors that Affect Glucose Meter Performance. The accuracy of your test results depends on many factors and varies depending on the brand and type of meter. These factors include:   Low red blood cell count (anemia).   Substances in your blood (such as uric acid, vitamin C, and others).   Environmental factors (temperature, humidity, altitude).   Name-brand versus generic test strips.   Calibration. Make sure your meter is set up properly. It is a good idea to do a calibration test with a control solution recommended by the manufacturer of your meter whenever you begin using a fresh bottle of test strips. This will help verify the accuracy of your meter.   Improperly stored, expired, or defective test strips. Keep your strips in a dry place with the lid on.   Soiled meter.   Inadequate blood sample.  NEW TECHNOLOGIES FOR GLUCOSE TESTING Alternative site testing Some glucose meters allow testing blood from alternative  sites. These include the:  Upper arm.   Forearm.   Base of the thumb.   Thigh.  Sampling blood from alternative sites may be desirable. However, it may have some limitations. Blood in the fingertips show changes in glucose levels more quickly than blood in other parts of the body. This means that alternative site test results may be different from fingertip test results, not because of the meter's ability to test accurately, but because the actual glucose concentration can be different.  Continuous Glucose Monitoring Devices to measure your blood glucose continuously are available, and others are in development. These methods can be more expensive than self-monitoring with a glucose meter. However, it is uncertain how effective and reliable these devices are. Your caregiver will advise you if this approach makes sense for you. IF BLOOD SUGARS ARE CONTROLLED, PEOPLE WITH DIABETES REMAIN HEALTHIER.  SMBG is an important part of the treatment plan of patients with diabetes mellitus. Below are reasons for using SMBG:   It confirms that your glucose is at a specific, healthy level.   It detects hypoglycemia and severe hyperglycemia.   It allows you and your caregiver to make adjustments in response to changes in lifestyle for individuals requiring medicine.   It determines the need for starting insulin therapy in temporary diabetes that happens during pregnancy (gestational diabetes).  Document Released: 10/09/2003 Document Revised: 09/25/2011 Document Reviewed: 01/30/2011 [facility] Patient Information 2012 [place], [place].Diabetes and Standards of Medical Care  Diabetes is complicated. You may find that your diabetes team includes a dietitian, nurse, diabetes educator, eye doctor, and more. To help everyone know what is going on and to help you get the care you deserve, the following schedule of care was developed to help keep you on track. Below are the tests, exams, vaccines, medicines,  education, and plans you will need. A1c test  Performed at least 2 times a year if you are meeting treatment goals.   Performed 4 times a year if therapy has changed or if you are not meeting therapy/glycemic goals.  Aspirin medicine  Take daily as directed by your caregiver.  Blood pressure test  Performed at every routine medical visit. The goal is less than 130/80 mm/Hg.  Dental exam  Get a dental exam at least 2 times a year.  Dilated eye exam (  retinal exam)  Type 1 diabetes: Get an exam within 5 years of diagnosis and then yearly.   Type 2 diabetes: Get an exam at diagnosis and then yearly.  All exams thereafter can be extended to every 2 to 3 years if one or more exams have been normal. Foot care exam  Visual foot exams are performed at every routine medical visit. The exams check for cuts, injuries, or other problems with the feet.   A comprehensive foot exam should be done yearly. This includes visual inspection as well as assessing foot pulses and testing for loss of sensation.  Kidney function test (urine microalbumin)  Performed once a year.   Type 1 diabetes: The first test is performed 5 years after diagnosis.   Type 2 diabetes: The first test is performed at the time of diagnosis.   A serum creatinine and estimated glomerular filtration rate (eGFR) test is done once a year to tell the level of chronic kidney disease (CKD), if present.  Lipid profile (Cholesterol, HDL, LDL, Triglycerides)  Performed once a year for most people. If at low risk, may be assessed every 2 years.   The goal for LDL is less than 100 mg/dl. If at high risk, the goal is less than 70 mg/dl.   The goal for HDL is higher than 40 mg/dl for men and higher than 50 mg/dl for women.   The goal for triglycerides is less than 150 mg/dl.  Flu vaccine, pneumonia vaccine, and hepatitis B vaccine  The flu vaccine is recommended yearly.   The pneumonia vaccine is generally given once in a  lifetime. However, there are some instances where another vaccine is recommended. Check with your caregiver.   The hepatitis B vaccine is also recommended for adults with diabetes.  Diabetes self-management education  Recommended at diagnosis and ongoing as needed.  Treatment plan  Reviewed at every medical visit.  Document Released: 08/03/2009 Document Revised: 09/25/2011 Document Reviewed: 04/08/2011 [facility] Patient Information 2012 [place], [place].Folliculitis  Folliculitis is an infection and inflammation of the hair follicles. Hair follicles become red and irritated. This inflammation is usually caused by bacteria. The bacteria thrive in warm, moist environments. This condition can be seen anywhere on the body.  CAUSES The most common cause of folliculitis is an infection by germs (bacteria). Fungal and viral infections can also cause the condition. Viral infections may be more common in people whose bodies are unable to fight disease well (weakened immune systems). Examples include people with:  AIDS.   An organ transplant.   Cancer.  People with depressed immune systems, diabetes, or obesity, have a greater risk of getting folliculitis than the general population. Certain chemicals, especially oils and tars, also can cause folliculitis. SYMPTOMS  An early sign of folliculitis is a small, white or yellow pus-filled, itchy lesion (pustule). These lesions appear on a red, inflamed follicle. They are usually less than 5 mm (.20 inches).   The most likely starting points are the scalp, thighs, legs, back and buttocks. Folliculitis is also frequently found in areas of repeated shaving.   When an infection of the follicle goes deeper, it becomes a boil or furuncle. A group of closely packed boils create a larger lesion (a carbuncle). These sores (lesions) tend to occur in hairy, sweaty areas of the body.  TREATMENT   A doctor who specializes in skin problems (dermatologists) treats  mild cases of folliculitis with antiseptic washes.   They also use a skin application which kills germs (  topical antibiotics). Tea tree oil is a good topical antiseptic as well. It can be found at a health food store. A small percentage of individuals may develop an allergy to the tea tree oil.   Mild to moderate boils respond well to warm water compresses applied three times daily.   In some cases, oral antibiotics should be taken with the skin treatment.   If lesions contain large quantities of pus or fluid, your caregiver may drain them. This allows the topical antibiotics to get to the affected areas better.   Stubborn cases of folliculitis may respond to laser hair removal. This process uses a high intensity light beam (a laser) to destroy the follicle and reduces the scarring from folliculitis. After laser hair removal, hair will no longer grow in the laser treated area.  Patients with long-lasting folliculitis need to find out where the infection is coming from. Germs can live in the nostrils of the patient. This can trigger an outbreak now and then. Sometimes the bacteria live in the nostrils of a family member. This person does not develop the disorder but they repeatedly re-expose others to the germ. To break the cycle of recurrence in the patient, the family member must also undergo treatment. PREVENTION   Individuals who are predisposed to folliculitis should be extremely careful about personal hygiene.   Application of antiseptic washes may help prevent recurrences.   A topical antibiotic cream, mupirocin (Bactroban), has been effective at reducing bacteria in the nostrils. It is applied inside the nose with your little finger. This is done twice daily for a week. Then it is repeated every 6 months.   Because follicle disorders tend to come back, patients must receive follow-up care. Your caregiver may be able to recognize a recurrence before it becomes severe.  SEEK IMMEDIATE  MEDICAL CARE IF:   You develop redness, swelling, or increasing pain in the area.   You have a fever.   You are not improving with treatment or are getting worse.   You have any other questions or concerns.  Document Released: 12/15/2001 Document Revised: 09/25/2011 Document Reviewed: 10/11/2008 [facility] Patient Information 2012 [place], [place].

## 2012-01-18 NOTE — ED Notes (Signed)
The pt shaved his pubic hair 3-4 days ago and now he has pain and he feels like its infected

## 2012-01-18 NOTE — ED Notes (Signed)
Pt reports he was dx w/DM 8-10 yrs ago, pt reports he quit taking his dm medication 6 months ago, states "I didn't feel like I needed it anymore."

## 2012-01-19 ENCOUNTER — Encounter (HOSPITAL_COMMUNITY): Payer: Self-pay | Admitting: Emergency Medicine

## 2012-01-19 ENCOUNTER — Emergency Department (HOSPITAL_COMMUNITY)
Admission: EM | Admit: 2012-01-19 | Discharge: 2012-01-20 | Disposition: A | Discharge status: Home / Self Care | Payer: Self-pay | Attending: Emergency Medicine | Admitting: Emergency Medicine

## 2012-01-19 DIAGNOSIS — E7800 Pure hypercholesterolemia, unspecified: Secondary | ICD-10-CM | POA: Insufficient documentation

## 2012-01-19 DIAGNOSIS — Z9119 Patient's noncompliance with other medical treatment and regimen: Secondary | ICD-10-CM | POA: Insufficient documentation

## 2012-01-19 DIAGNOSIS — E119 Type 2 diabetes mellitus without complications: Secondary | ICD-10-CM | POA: Insufficient documentation

## 2012-01-19 DIAGNOSIS — Z760 Encounter for issue of repeat prescription: Secondary | ICD-10-CM

## 2012-01-19 DIAGNOSIS — I10 Essential (primary) hypertension: Secondary | ICD-10-CM | POA: Insufficient documentation

## 2012-01-19 DIAGNOSIS — R739 Hyperglycemia, unspecified: Secondary | ICD-10-CM

## 2012-01-19 DIAGNOSIS — Z91199 Patient's noncompliance with other medical treatment and regimen due to unspecified reason: Secondary | ICD-10-CM | POA: Insufficient documentation

## 2012-01-19 LAB — DIFFERENTIAL
Basophils Absolute: 0 10*3/uL (ref 0.0–0.1)
Basophils Relative: 0 % (ref 0–1)
Eosinophils Absolute: 0.1 10*3/uL (ref 0.0–0.7)
Eosinophils Relative: 1 % (ref 0–5)
Monocytes Absolute: 0.4 10*3/uL (ref 0.1–1.0)
Monocytes Relative: 5 % (ref 3–12)

## 2012-01-19 LAB — BASIC METABOLIC PANEL
BUN: 14 mg/dL (ref 6–23)
Calcium: 9.9 mg/dL (ref 8.4–10.5)
Creatinine, Ser: 0.57 mg/dL (ref 0.50–1.35)
GFR calc Af Amer: >90 mL/min (ref >90)
GFR calc non Af Amer: >90 mL/min (ref >90)

## 2012-01-19 LAB — CBC
HCT: 43.3 % (ref 39.0–52.0)
Hemoglobin: 15.3 g/dL (ref 13.0–17.0)
MCH: 27.8 pg (ref 26.0–34.0)
MCHC: 35.3 g/dL (ref 30.0–36.0)
RDW: 12.7 % (ref 11.5–15.5)

## 2012-01-19 LAB — GLUCOSE, CAPILLARY: Glucose-Capillary: 256 mg/dL — ABNORMAL HIGH (ref 70–99)

## 2012-01-19 MED ORDER — PIOGLITAZONE HCL 15 MG PO TABS
15.0000 mg | ORAL_TABLET | Freq: Every day | ORAL | Status: DC
  Administered 2012-01-20: 15 mg via ORAL
  Filled 2012-01-19: qty 1

## 2012-01-19 MED ORDER — METFORMIN HCL 500 MG PO TABS
500.0000 mg | ORAL_TABLET | Freq: Once | ORAL | Status: AC
  Administered 2012-01-20: 500 mg via ORAL
  Filled 2012-01-19: qty 1

## 2012-01-19 NOTE — ED Provider Notes (Signed)
History     CSN: [id number]  Arrival date & time 01/19/12  2029   First MD Initiated Contact with Patient 01/19/12 2308      Chief Complaint  Patient presents with  . Hyperglycemia    (Consider location/radiation/quality/duration/timing/severity/associated sxs/prior treatment) Patient is a 46 y.o. male presenting with diabetes problem. The history is provided by the patient. No language interpreter was used.  Diabetes He has type 2 diabetes mellitus. No MedicAlert identification noted. His disease course has been fluctuating. Associated symptoms include polydipsia, polyphagia and polyuria. Pertinent negatives for diabetes include no blurred vision, no chest pain, no foot paresthesias, no foot ulcerations, no visual change and no weakness. Symptoms are worsening.   patient states she's been off his diabetes medication for 3 months. States a friend of his told him it was a good idea. He was also here yesterday and his sugar was over 400 but left without treatment.  cbg today is 264.  No hyperventilation.  No distress.  [facility] for pcp.  Needs medication refill for actose and glipzide.   Past Medical History  Diagnosis Date  . Diabetes mellitus   . Hypertension   . High cholesterol     History reviewed. No pertinent past surgical history.  Family History  Problem Relation Age of Onset  . Ulcers Mother     History  Substance Use Topics  . Smoking status: Never Smoker   . Smokeless tobacco: Never Used  . Alcohol Use: Yes      Review of Systems  Constitutional: Negative.   HENT: Negative.   Eyes: Negative.  Negative for blurred vision.  Respiratory: Negative.   Cardiovascular: Negative.  Negative for chest pain.  Gastrointestinal: Negative.  Negative for nausea, vomiting and diarrhea.  Genitourinary: Positive for polyuria.  Neurological: Negative.  Negative for weakness.  Hematological: Positive for polydipsia and polyphagia.  Psychiatric/Behavioral: Negative.      Allergies  Review of patient's allergies indicates no known allergies.  Home Medications   Current Outpatient Rx  Name Route Sig Dispense Refill  . SULFAMETHOXAZOLE-TMP DS 800-160 MG PO TABS Oral Take 1 tablet by mouth 2 (two) times daily. 20 tablet 0    BP 134/95  Pulse 80  Temp(Src) 97.8 F (36.6 C) (Oral)  Resp 18  SpO2 97%  Physical Exam  Nursing note and vitals reviewed. Constitutional: He is oriented to person, place, and time. He appears well-developed and well-nourished.  HENT:  Head: Normocephalic.  Eyes: Conjunctivae and EOM are normal. Pupils are equal, round, and reactive to light.  Neck: Normal range of motion. Neck supple.  Cardiovascular: Normal rate.   Pulmonary/Chest: Effort normal.  Abdominal: Soft.  Musculoskeletal: Normal range of motion.  Neurological: He is alert and oriented to person, place, and time.  Skin: Skin is warm and dry.  Psychiatric: He has a normal mood and affect.    ED Course  Procedures (including critical care time)  Labs Reviewed  BASIC METABOLIC PANEL - Abnormal; Notable for the following:    Sodium 134 (*)    Glucose, Bld 264 (*)    All other components within normal limits  GLUCOSE, CAPILLARY - Abnormal; Notable for the following:    Glucose-Capillary 256 (*)    All other components within normal limits  CBC  DIFFERENTIAL   No results found.   No diagnosis found.    MDM  Here for medication refill.  Off diabetic meds x 3 months.  cbg 264.  Actos and metformin given in  the er with rx for both.  Will follow up with [facility] asap.  Patient ready for discharge.    Labs Reviewed  BASIC METABOLIC PANEL - Abnormal; Notable for the following:    Sodium 134 (*)    Glucose, Bld 264 (*)    All other components within normal limits  GLUCOSE, CAPILLARY - Abnormal; Notable for the following:    Glucose-Capillary 256 (*)    All other components within normal limits  CBC  DIFFERENTIAL  LAB REPORT - SCANNED           Jethro Bastos, NP 01/20/12 1734

## 2012-01-19 NOTE — ED Notes (Signed)
The pt has not had any meds for 3 months and he is concerned that his bklood suagr is high.  He was here last pm for the same and he wants rxs for all his meds to get them filled

## 2012-01-19 NOTE — ED Provider Notes (Signed)
Medical screening examination/treatment/procedure(s) were performed by non-physician practitioner and as supervising physician I was immediately available for consultation/collaboration.  Brandonn Capelli, MD 01/19/12 0001 

## 2012-01-19 NOTE — ED Notes (Signed)
PT. REPORTS ELEVATED BLOOD SUGAR YESTERDAY = 400+ , STATES DID NOT TAKE HIS MEDICATIONS ( GLUCOPHAGE/METFORMIN /ACTOS ) FOR 3 MONTHS . RESPIRATIONS UNLABORED. DENIES PAIN OR DISCOMFORT.

## 2012-01-20 MED ORDER — PIOGLITAZONE HCL 15 MG PO TABS: 15.0000 mg | ORAL_TABLET | Freq: Every day | ORAL | Status: DC

## 2012-01-20 MED ORDER — GLIPIZIDE 10 MG PO TABS: 5.0000 mg | ORAL_TABLET | Freq: Two times a day (BID) | ORAL | Status: DC

## 2012-01-20 NOTE — Discharge Instructions (Signed)
Mr Fabela we gave you a dose of Actos and a dose of metformin in the ER tonight. I can refill your glipizide to injure Actos you [facility] sure that the doses) Actos. We'll start her on 15 mg. Call in the morning and get the appointments [facility] as soon as possible. Until then you need to take her diabetic medication. Return to the ER for severe pain nausea vomiting or any other concerns. Drink lots of water tonight before you go to bed.

## 2012-01-22 NOTE — ED Provider Notes (Signed)
Medical screening examination/treatment/procedure(s) were performed by non-physician practitioner and as supervising physician I was immediately available for consultation/collaboration.   Karema Tocci, MD 01/22/12 0656 

## 2012-02-23 ENCOUNTER — Emergency Department (INDEPENDENT_AMBULATORY_CARE_PROVIDER_SITE_OTHER)
Admission: EM | Admit: 2012-02-23 | Discharge: 2012-02-23 | Disposition: A | Discharge status: Home / Self Care | Payer: Self-pay | Source: Home / Self Care

## 2012-02-23 ENCOUNTER — Encounter (HOSPITAL_COMMUNITY): Payer: Self-pay

## 2012-02-23 DIAGNOSIS — N4822 Cellulitis of corpus cavernosum and penis: Secondary | ICD-10-CM

## 2012-02-23 DIAGNOSIS — N4829 Other inflammatory disorders of penis: Secondary | ICD-10-CM

## 2012-02-23 MED ORDER — MINOCYCLINE HCL 100 MG PO CAPS: 100.0000 mg | ORAL_CAPSULE | Freq: Two times a day (BID) | ORAL | Status: DC

## 2012-02-23 NOTE — ED Provider Notes (Signed)
Chase Mccormick is a 46 y.o. male who presents to Urgent Care today for penile cellulitis.  Patient has developed skin irritation and pain starting yesterday. He had a similar abscess on his abdomen 2 weeks ago that was treated with antibiotics.  He denies any fevers or chills significant penile pain and feels well otherwise.  He denies any discharge.    PMH reviewed. Significant for diabetes ROS as above otherwise neg.  no chest pains, palpitations, fevers, chills, abdominal pain nausea or vomiting. Medications reviewed. No current facility-administered medications for this encounter.   Current Outpatient Prescriptions  Medication Sig Dispense Refill  . METFORMIN HCL PO Take by mouth.      . Niacin-Lovastatin (ADVICOR PO) Take by mouth.      Marland Kitchen glipiZIDE (GLUCOTROL) 10 MG tablet Take 0.5 tablets (5 mg total) by mouth 2 (two) times daily before a meal.  30 tablet  0  . minocycline (MINOCIN) 100 MG capsule Take 1 capsule (100 mg total) by mouth 2 (two) times daily.  14 capsule  0  . pioglitazone (ACTOS) 15 MG tablet Take 1 tablet (15 mg total) by mouth daily.  30 tablet  0    Exam:  BP 146/96  Pulse 77  Temp(Src) 97.6 F (36.4 C) (Oral)  Resp 18  SpO2 100% Gen: Well NAD PENIS : Base of the dorsal aspect of the penis has a quarter size area of induration without fluctuance. Mildly tender to palpation. Induration limited to the skin and no involvement of the shaft of the penis itself.  No discharge.   No results found for this or any previous visit (from the past 24 hour(s)). No results found.  Assessment and Plan: 46 y.o. male with penile cellulitis. Mild does not appear to extend very far or deep.  Does not appear to be an abscess at this point however it may develop into one. Plan to treat with the cycling for one week and followup with primary care doctor. If not improving recommended going to the emergency room. Discussed warning signs or symptoms. Patient expresses  understanding.       Rodolph Bong, MD 02/23/12 2116

## 2012-02-23 NOTE — Discharge Instructions (Signed)
Thank you for coming in today. Please take the minocycline twice a day for 1 week.  If it gets worse go to the hospital. See your doctor in one week. Go to the emergency room for fevers chills or worsening pain.

## 2012-02-23 NOTE — ED Notes (Signed)
States he has area to penis he is concerned about for 2 days.  States he had a similar area to his abdomen that was diagnosed as an abscess.  Denies discharge or swelling.

## 2012-02-25 NOTE — ED Provider Notes (Signed)
Medical screening examination/treatment/procedure(s) were performed by the resident and as supervising physician I was immediately available for consultation/collaboration.  Luiz Blare MD   Luiz Blare, MD 02/25/12 [phone]

## 2012-04-05 ENCOUNTER — Emergency Department (HOSPITAL_COMMUNITY)
Admission: EM | Admit: 2012-04-05 | Discharge: 2012-04-05 | Disposition: A | Discharge status: Home / Self Care | Payer: Self-pay | Attending: Emergency Medicine | Admitting: Emergency Medicine

## 2012-04-05 ENCOUNTER — Encounter (HOSPITAL_COMMUNITY): Payer: Self-pay | Admitting: *Deleted

## 2012-04-05 DIAGNOSIS — L0231 Cutaneous abscess of buttock: Secondary | ICD-10-CM | POA: Insufficient documentation

## 2012-04-05 DIAGNOSIS — I10 Essential (primary) hypertension: Secondary | ICD-10-CM | POA: Insufficient documentation

## 2012-04-05 DIAGNOSIS — E119 Type 2 diabetes mellitus without complications: Secondary | ICD-10-CM | POA: Insufficient documentation

## 2012-04-05 DIAGNOSIS — Z79899 Other long term (current) drug therapy: Secondary | ICD-10-CM | POA: Insufficient documentation

## 2012-04-05 DIAGNOSIS — L0291 Cutaneous abscess, unspecified: Secondary | ICD-10-CM

## 2012-04-05 DIAGNOSIS — L03317 Cellulitis of buttock: Secondary | ICD-10-CM | POA: Insufficient documentation

## 2012-04-05 DIAGNOSIS — E7800 Pure hypercholesterolemia, unspecified: Secondary | ICD-10-CM | POA: Insufficient documentation

## 2012-04-05 MED ORDER — HYDROCODONE-ACETAMINOPHEN 5-325 MG PO TABS
1.0000 | ORAL_TABLET | Freq: Once | ORAL | Status: AC
  Administered 2012-04-05: 1 via ORAL
  Filled 2012-04-05: qty 1

## 2012-04-05 MED ORDER — HYDROCODONE-ACETAMINOPHEN 5-325 MG PO TABS: 1.0000 | ORAL_TABLET | ORAL | Status: AC | PRN

## 2012-04-05 MED ORDER — SULFAMETHOXAZOLE-TRIMETHOPRIM 800-160 MG PO TABS: 1.0000 | ORAL_TABLET | Freq: Two times a day (BID) | ORAL | Status: AC

## 2012-04-05 NOTE — ED Notes (Signed)
Pt was discharged after triage because he did not answer because he was in his car reading a book

## 2012-04-05 NOTE — ED Notes (Signed)
No answer anywhere in the waiting rooms

## 2012-04-05 NOTE — ED Notes (Signed)
No answer

## 2012-04-05 NOTE — ED Notes (Signed)
The pt was moved from the computer after he was called x 3 with no response.  When he came back in he said he was hurting so much he had to lie down in his car for comfort

## 2012-04-05 NOTE — Discharge Instructions (Signed)
Abscess An abscess (boil or furuncle) is an infected area that contains a collection of pus.  SYMPTOMS Signs and symptoms of an abscess include pain, tenderness, redness, or hardness. You may feel a moveable soft area under your skin. An abscess can occur anywhere in the body.  TREATMENT  A surgical cut (incision) may be made over your abscess to drain the pus. Gauze may be packed into the space or a drain may be looped through the abscess cavity (pocket). This provides a drain that will allow the cavity to heal from the inside outwards. The abscess may be painful for a few days, but should feel much better if it was drained.  Your abscess, if seen early, may not have localized and may not have been drained. If not, another appointment may be required if it does not get better on its own or with medications. HOME CARE INSTRUCTIONS   Only take over-the-counter or prescription medicines for pain, discomfort, or fever as directed by your caregiver.   Take your antibiotics as directed if they were prescribed. Finish them even if you start to feel better.   Keep the skin and clothes clean around your abscess.   If the abscess was drained, you will need to use gauze dressing to collect any draining pus. Dressings will typically need to be changed 3 or more times a day.   The infection may spread by skin contact with others. Avoid skin contact as much as possible.   Practice good hygiene. This includes regular hand washing, cover any draining skin lesions, and do not share personal care items.   If you participate in sports, do not share athletic equipment, towels, whirlpools, or personal care items. Shower after every practice or tournament.   If a draining area cannot be adequately covered:   Do not participate in sports.   Children should not participate in day care until the wound has healed or drainage stops.   If your caregiver has given you a follow-up appointment, it is very important  to keep that appointment. Not keeping the appointment could result in a much worse infection, chronic or permanent injury, pain, and disability. If there is any problem keeping the appointment, you must call back to this facility for assistance.  SEEK MEDICAL CARE IF:   You develop increased pain, swelling, redness, drainage, or bleeding in the wound site.   You develop signs of generalized infection including muscle aches, chills, fever, or a general ill feeling.   You have an oral temperature above 102 F (38.9 C).  MAKE SURE YOU:   Understand these instructions.   Will watch your condition.   Will get help right away if you are not doing well or get worse.  Document Released: 07/16/2005 Document Revised: 09/25/2011 Document Reviewed: 05/09/2008 [facility] Patient Information 2012 [place].Cellulitis Cellulitis is an infection of the skin and the tissue beneath it. The area is typically red and tender. It is caused by germs (bacteria) (usually staph or strep) that enter the body through cuts or sores. Cellulitis most commonly occurs in the arms or lower legs.  HOME CARE INSTRUCTIONS   If you are given a prescription for medications which kill germs (antibiotics), take as directed until finished.   If the infection is on the arm or leg, keep the limb elevated as able.   Use a warm cloth several times per day to relieve pain and encourage healing.   See your caregiver for recheck of the infected site as  pain and encourage healing.   See your caregiver for recheck of the infected site as directed if problems arise.   Only take over-the-counter or prescription medicines for pain, discomfort, or fever as directed by your caregiver.  SEEK MEDICAL CARE IF:    The area of redness (inflammation) is spreading, there are red streaks coming from the infected site, or if a part of the infection begins to turn dark in color.   The joint or bone underneath the infected skin becomes painful after the skin has healed.   The infection returns in the same or another area after it seems to have  gone away.   A boil or bump swells up. This may be an abscess.   New, unexplained problems such as pain or fever develop.  SEEK IMMEDIATE MEDICAL CARE IF:    You have a fever.   You or your child feels drowsy or lethargic.   There is vomiting, diarrhea, or lasting discomfort or feeling ill (malaise) with muscle aches and pains.  MAKE SURE YOU:    Understand these instructions.   Will watch your condition.   Will get help right away if you are not doing well or get worse.  Document Released: 07/16/2005 Document Revised: 09/25/2011 Document Reviewed: 05/24/2008  [employer] Patient Information 2012 [employer].

## 2012-04-05 NOTE — ED Notes (Signed)
The pt has 2 lesions on each buttock that has been hurtiong him for 2 days.  He has had the same in the past

## 2012-04-05 NOTE — ED Provider Notes (Signed)
History     CSN: [id number]  Arrival date & time 04/05/12  1631   First MD Initiated Contact with Patient 04/05/12 2113      Chief Complaint  Patient presents with  . boils     (Consider location/radiation/quality/duration/timing/severity/associated sxs/prior treatment) HPI Comments: Patient with a history of recurrent abscesses and skin infections presents tonight with abscess to left buttock and right buttock without drainage - states that they started about 2 days ago - have gradually worsened - are associated with fever and chills - denies chest pain shortness of breath, nausea, vomiting, states blood sugars have been under control.  Patient is a 46 y.o. male presenting with abscess. The history is provided by the patient. No language interpreter was used.  Abscess  This is a recurrent problem. The current episode started yesterday. The onset was gradual. The problem occurs frequently. The problem has been gradually worsening. The abscess is present on the left buttock and right buttock. The problem is moderate. The abscess is characterized by redness, painfulness and swelling. The patient was exposed to OTC medications. The abscess first occurred at home. Associated symptoms include a fever. Pertinent negatives include no anorexia, no decrease in physical activity, not sleeping less, not drinking less, no fussiness, not sleeping more, no diarrhea, no vomiting, no congestion, no rhinorrhea, no sore throat, no decreased responsiveness and no cough. His past medical history is significant for skin abscesses in family. There were no sick contacts. He has received no recent medical care.    Past Medical History  Diagnosis Date  . Diabetes mellitus   . Hypertension   . High cholesterol     History reviewed. No pertinent past surgical history.  Family History  Problem Relation Age of Onset  . Ulcers Mother     History  Substance Use Topics  . Smoking status: Never Smoker   .  Smokeless tobacco: Never Used  . Alcohol Use: Yes      Review of Systems  Constitutional: Positive for fever and chills. Negative for decreased responsiveness.  HENT: Negative for congestion, sore throat, rhinorrhea and neck pain.   Eyes: Negative for pain.  Respiratory: Negative for cough.   Gastrointestinal: Negative for vomiting, diarrhea and anorexia.  Genitourinary: Negative for dysuria.  Musculoskeletal: Negative for back pain.  Skin: Positive for rash.  Neurological: Negative for headaches.  All other systems reviewed and are negative.    Allergies  Review of patient's allergies indicates no known allergies.  Home Medications   Current Outpatient Rx  Name Route Sig Dispense Refill  . BC HEADACHE POWDER PO Oral Take 2 packets by mouth every 4 (four) hours as needed. For pain/fever    . GLIPIZIDE 10 MG PO TABS Oral Take 5 mg by mouth 2 (two) times daily before a meal.    . METFORMIN HCL 1000 MG PO TABS Oral Take 1,000 mg by mouth 2 (two) times daily with a meal.    . PIOGLITAZONE HCL 15 MG PO TABS Oral Take 15 mg by mouth daily.      BP 133/80  Pulse 76  Temp 98.6 F (37 C) (Oral)  Resp 20  SpO2 100%  Physical Exam  Nursing note and vitals reviewed. Constitutional: He is oriented to person, place, and time. He appears well-developed and well-nourished. No distress.  HENT:  Head: Normocephalic and atraumatic.  Right Ear: External ear normal.  Left Ear: External ear normal.  Nose: Nose normal.  Mouth/Throat: Oropharynx is clear and moist. No  oropharyngeal exudate.  Eyes: Conjunctivae are normal. Pupils are equal, round, and reactive to light. No scleral icterus.  Neck: Normal range of motion. Neck supple.  Cardiovascular: Normal rate, regular rhythm and normal heart sounds.  Exam reveals no gallop and no friction rub.   No murmur heard. Pulmonary/Chest: Effort normal and breath sounds normal. No respiratory distress. He has no wheezes. He has no rales. He  exhibits no tenderness.  Abdominal: Soft. Bowel sounds are normal. He exhibits no distension. There is no tenderness.  Musculoskeletal: Normal range of motion. He exhibits no edema and no tenderness.  Lymphadenopathy:    He has no cervical adenopathy.  Neurological: He is alert and oriented to person, place, and time. No cranial nerve deficit.  Skin: Skin is warm and dry. No rash noted. There is erythema. No pallor.       1cm area of fluctuance with 3cm area of induration to left buttock 1cm area of fluctuance with 2 cm area of induration to right buttock.  Psychiatric: He has a normal mood and affect. His behavior is normal. Judgment and thought content normal.    ED Course  Procedures (including critical care time)  Labs Reviewed - No data to display No results found.  INCISION AND DRAINAGE Performed by: Cherrie Distance C. Consent: Verbal consent obtained. Risks and benefits: risks, benefits and alternatives were discussed Type: abscess  Body area:left buttocks  Anesthesia: local infiltration  Local anesthetic: lidocaine 2% with epinephrine  Anesthetic total: 3 ml  Complexity: complex Blunt dissection to break up loculations  Drainage: none  Drainage amount: none  Packing material: none  Patient tolerance: Patient tolerated the procedure well with no immediate complications.  INCISION AND DRAINAGE Performed by: Cherrie Distance C. Consent: Verbal consent obtained. Risks and benefits: risks, benefits and alternatives were discussed Type: abscess  Body area: right buttocks/flank  Anesthesia: local infiltration  Local anesthetic: lidocaine 2% with epinephrine  Anesthetic total: 3 ml  Complexity: complex Blunt dissection to break up loculations  Drainage: purulent  Drainage amount: small  Packing material: 1/4 in iodoform gauze  Patient tolerance: Patient tolerated the procedure well with no immediate complications.  & abscesses   MDM  Paitent  with history of multiple abscesses and skin infections presents again with two separate abscesses - there was small amount of drainage from the right one so I was able to pack it - no drainage from the left one - will place on abx and he will return in 2 days for packing removal.        Scarlette Calico C. [place], [place] 04/05/12 2219

## 2012-04-06 NOTE — ED Provider Notes (Signed)
Medical screening examination/treatment/procedure(s) were performed by non-physician practitioner and as supervising physician I was immediately available for consultation/collaboration.  Flint Melter, MD 04/06/12 1045

## 2012-04-08 ENCOUNTER — Emergency Department (HOSPITAL_COMMUNITY)
Admission: EM | Admit: 2012-04-08 | Discharge: 2012-04-08 | Disposition: A | Discharge status: Home / Self Care | Payer: Self-pay | Attending: Emergency Medicine | Admitting: Emergency Medicine

## 2012-04-08 ENCOUNTER — Encounter (HOSPITAL_COMMUNITY): Payer: Self-pay | Admitting: Emergency Medicine

## 2012-04-08 DIAGNOSIS — E7800 Pure hypercholesterolemia, unspecified: Secondary | ICD-10-CM | POA: Insufficient documentation

## 2012-04-08 DIAGNOSIS — L0291 Cutaneous abscess, unspecified: Secondary | ICD-10-CM

## 2012-04-08 DIAGNOSIS — I10 Essential (primary) hypertension: Secondary | ICD-10-CM | POA: Insufficient documentation

## 2012-04-08 DIAGNOSIS — L0231 Cutaneous abscess of buttock: Secondary | ICD-10-CM | POA: Insufficient documentation

## 2012-04-08 DIAGNOSIS — E119 Type 2 diabetes mellitus without complications: Secondary | ICD-10-CM | POA: Insufficient documentation

## 2012-04-08 MED ORDER — BACITRACIN ZINC 500 UNIT/GM EX OINT
1.0000 "application " | TOPICAL_OINTMENT | Freq: Two times a day (BID) | CUTANEOUS | Status: DC
  Administered 2012-04-08: 1 via TOPICAL
  Filled 2012-04-08: qty 15

## 2012-04-08 MED ORDER — IBUPROFEN 800 MG PO TABS
800.0000 mg | ORAL_TABLET | Freq: Once | ORAL | Status: AC
  Administered 2012-04-08: 800 mg via ORAL
  Filled 2012-04-08: qty 1

## 2012-04-08 NOTE — Discharge Instructions (Signed)
Abscess An abscess (boil or furuncle) is an infected area under your skin. This area is filled with yellowish white fluid (pus). HOME CARE   Only take medicine as told by your doctor.   Keep the skin clean around your abscess. Keep clothes that may touch the abscess clean.   Change any bandages (dressings) as told by your doctor.   Avoid direct skin contact with other people. The infection can spread by skin contact with others.   Practice good hygiene and do not share personal care items.   Do not share athletic equipment, towels, or whirlpools. Shower after every practice or work out session.   If a draining area cannot be covered:   Do not play sports.   Children should not go to daycare until the wound has healed or until fluid (drainage) stops coming out of the wound.   See your doctor for a follow-up visit as told.  GET HELP RIGHT AWAY IF:   There is more pain, puffiness (swelling), and redness in the wound site.   There is fluid or bleeding from the wound site.   You have muscle aches, chills, fever, or feel sick.   You or your child has a temperature by mouth above 102 F (38.9 C), not controlled by medicine.   Your baby is older than 3 months with a rectal temperature of 102 F (38.9 C) or higher.  MAKE SURE YOU:   Understand these instructions.   Will watch your condition.   Will get help right away if you are not doing well or get worse.  Document Released: 03/24/2008 Document Revised: 09/25/2011 Document Reviewed: 03/24/2008 [employer] Patient Information 2012 [employer]. 

## 2012-04-08 NOTE — ED Notes (Signed)
Here for packing removal on right flank

## 2012-04-08 NOTE — ED Notes (Signed)
PT. REQUESTING PACKING REMOVAL AT BILATERAL BUTTOCKS ABSCESS.

## 2012-04-08 NOTE — ED Provider Notes (Addendum)
History     CSN: [id number]  Arrival date & time 04/08/12  4098   First MD Initiated Contact with Patient 04/08/12 [phone]      Chief Complaint  Patient presents with  . Abscess    (Consider location/radiation/quality/duration/timing/severity/associated sxs/prior treatment) HPI Comments: The patient had an abscess of his right lateral lower trunk incised and drained 3 days ago, packing was placed, antibiotic started and the patient has had significant improvement since that time. He denies fevers chills nausea vomiting swelling or redness.  Patient is a 46 y.o. male presenting with abscess. The history is provided by the patient and medical records.  Abscess  Pertinent negatives include no fever and no vomiting.    Past Medical History  Diagnosis Date  . Diabetes mellitus   . Hypertension   . High cholesterol     History reviewed. No pertinent past surgical history.  Family History  Problem Relation Age of Onset  . Ulcers Mother     History  Substance Use Topics  . Smoking status: Never Smoker   . Smokeless tobacco: Never Used  . Alcohol Use: Yes      Review of Systems  Constitutional: Negative for fever and chills.  Gastrointestinal: Negative for nausea and vomiting.  Skin: Positive for rash. Wound:  Abscess is improving.       abscess    Allergies  Review of patient's allergies indicates no known allergies.  Home Medications   Current Outpatient Rx  Name Route Sig Dispense Refill  . BC HEADACHE POWDER PO Oral Take 2 packets by mouth every 4 (four) hours as needed. For pain/fever    . GLIPIZIDE 10 MG PO TABS Oral Take 5 mg by mouth 2 (two) times daily before a meal.    . HYDROCODONE-ACETAMINOPHEN 5-325 MG PO TABS Oral Take 1 tablet by mouth every 4 (four) hours as needed for pain. 30 tablet 0  . METFORMIN HCL 1000 MG PO TABS Oral Take 1,000 mg by mouth 2 (two) times daily with a meal.    . PIOGLITAZONE HCL 15 MG PO TABS Oral Take 15 mg by mouth daily.      . SULFAMETHOXAZOLE-TRIMETHOPRIM 800-160 MG PO TABS Oral Take 1 tablet by mouth 2 (two) times daily. 14 tablet 0    BP 118/62  Pulse 68  Temp 97.5 F (36.4 C) (Oral)  Resp 16  SpO2 96%  Physical Exam  Nursing note and vitals reviewed. Constitutional: He appears well-developed and well-nourished. No distress.  HENT:  Head: Normocephalic and atraumatic.  Eyes: Conjunctivae are normal. Right eye exhibits no discharge. Left eye exhibits no discharge. No scleral icterus.  Cardiovascular: Normal rate and regular rhythm.   No murmur heard. Pulmonary/Chest: Effort normal and breath sounds normal.  Musculoskeletal: He exhibits tenderness. He exhibits no edema.  Skin: Skin is warm and dry. He is not diaphoretic.       Right lateral lower trunk with incised and drained abscess which is nontender, minimally indurated without any purulent material left in the wound. Packing removed.  Second abscess on the left but talks, still draining purulent material, is spontaneously draining through small hole, no incision    ED Course  Procedures (including critical care time)  INCISION AND DRAINAGE Performed by: Vida Roller Consent: Verbal consent obtained. Risks and benefits: risks, benefits and alternatives were discussed Type: abscess  Body area: L buttock  Anesthesia: local infiltration  Local anesthetic: lidocaine 1% without epinephrine  Anesthetic total: 4 ml  Complexity: complex Blunt  dissection to break up loculations  Drainage: purulent  Drainage amount: small  Packing material: 1/4 in iodoform gauze  Patient tolerance: Patient tolerated the procedure well with no immediate complications.     Labs Reviewed - No data to display No results found.   1. Abscess       MDM  Packing removed, bacitracin sterile dressing applied, patient given instructions for followup. There are no signs of recurrent infection at this time.        Vida Roller, MD 04/08/12  1308  Vida Roller, MD 04/08/12 [phone]

## 2012-08-06 ENCOUNTER — Encounter (HOSPITAL_COMMUNITY): Payer: Self-pay | Admitting: Emergency Medicine

## 2012-08-06 ENCOUNTER — Emergency Department (HOSPITAL_COMMUNITY)
Admission: EM | Admit: 2012-08-06 | Discharge: 2012-08-06 | Disposition: A | Discharge status: Home / Self Care | Payer: Self-pay | Attending: Emergency Medicine | Admitting: Emergency Medicine

## 2012-08-06 DIAGNOSIS — E7800 Pure hypercholesterolemia, unspecified: Secondary | ICD-10-CM | POA: Insufficient documentation

## 2012-08-06 DIAGNOSIS — I10 Essential (primary) hypertension: Secondary | ICD-10-CM | POA: Insufficient documentation

## 2012-08-06 DIAGNOSIS — E119 Type 2 diabetes mellitus without complications: Secondary | ICD-10-CM | POA: Insufficient documentation

## 2012-08-06 DIAGNOSIS — H5789 Other specified disorders of eye and adnexa: Secondary | ICD-10-CM | POA: Insufficient documentation

## 2012-08-06 DIAGNOSIS — Z760 Encounter for issue of repeat prescription: Secondary | ICD-10-CM | POA: Insufficient documentation

## 2012-08-06 DIAGNOSIS — H5710 Ocular pain, unspecified eye: Secondary | ICD-10-CM | POA: Insufficient documentation

## 2012-08-06 MED ORDER — LISINOPRIL 10 MG PO TABS: 10.0000 mg | ORAL_TABLET | Freq: Every day | ORAL | Status: DC

## 2012-08-06 MED ORDER — SULFACETAMIDE SODIUM 10 % OP SOLN: 1.0000 [drp] | Freq: Four times a day (QID) | OPHTHALMIC | Status: AC

## 2012-08-06 MED ORDER — PIOGLITAZONE HCL 15 MG PO TABS: 15.0000 mg | ORAL_TABLET | Freq: Every day | ORAL | Status: DC

## 2012-08-06 MED ORDER — METFORMIN HCL 1000 MG PO TABS: 1000.0000 mg | ORAL_TABLET | Freq: Two times a day (BID) | ORAL | Status: DC

## 2012-08-06 NOTE — ED Notes (Signed)
Report received, assumed care.  

## 2012-08-06 NOTE — ED Provider Notes (Signed)
History     CSN: [id number]  Arrival date & time 08/06/12  4098   First MD Initiated Contact with Patient 08/06/12 0606      Chief Complaint  Patient presents with  . Eye Pain    (Consider location/radiation/quality/duration/timing/severity/associated sxs/prior treatment) HPI The patient presents to the emergency department with left eye pain.  The patient states the pain began last night around 7pm.  He also notes erythema of his left eye.  He is no longer in pain at this time.  He denies discharge, increased tearing, blurred vision, loss of vision, diplopia, headache, dizziness, injury, and fever.  The patient has a history of diabetes and hypertension and states he has been out of his Lisinopril and Actos for 5 days.      Past Medical History  Diagnosis Date  . Diabetes mellitus   . Hypertension   . High cholesterol     History reviewed. No pertinent past surgical history.  Family History  Problem Relation Age of Onset  . Ulcers Mother     History  Substance Use Topics  . Smoking status: Never Smoker   . Smokeless tobacco: Never Used  . Alcohol Use: Yes      Review of Systems All other systems negative except as documented in the HPI. All pertinent positives and negatives as reviewed in the HPI.  Allergies  Review of patient's allergies indicates no known allergies.  Home Medications   Current Outpatient Rx  Name Route Sig Dispense Refill  . LISINOPRIL 10 MG PO TABS Oral Take 10 mg by mouth daily.    Marland Kitchen METFORMIN HCL 1000 MG PO TABS Oral Take 1,000 mg by mouth 2 (two) times daily with a meal.    . PIOGLITAZONE HCL 15 MG PO TABS Oral Take 15 mg by mouth daily.      BP 142/100  Temp 98.1 F (36.7 C) (Oral)  Resp 18  SpO2 99%  Physical Exam  Constitutional: He is oriented to person, place, and time. He appears well-developed and well-nourished.  HENT:  Head: Normocephalic and atraumatic.  Right Ear: External ear normal.  Left Ear: External ear  normal.  Mouth/Throat: Oropharynx is clear and moist.  Eyes: Conjunctivae normal, EOM and lids are normal. Pupils are equal, round, and reactive to light.  Neck: Normal range of motion. Neck supple.  Cardiovascular: Normal rate, regular rhythm and normal heart sounds.   Pulmonary/Chest: Effort normal and breath sounds normal.  Neurological: He is alert and oriented to person, place, and time. No cranial nerve deficit.  Skin: Skin is warm and dry.    ED Course  Procedures (including critical care time)  The patient is mainly here for med refills. The patient states that last night he had some eye pain and redness. Patient is sleeping in the room. The patient has no eye pain at this time. The patient did not have blurred vision  MDM          Carlyle Dolly, PA-C 08/06/12 0730  Carlyle Dolly, PA-C 08/06/12 [phone]

## 2012-08-06 NOTE — ED Provider Notes (Signed)
Medical screening examination/treatment/procedure(s) were conducted as a shared visit with non-physician practitioner(s) and myself.  I personally evaluated the patient during the encounter  Bracken Moffa, MD 08/06/12 0807 

## 2012-08-06 NOTE — ED Notes (Signed)
PT. REPORTS LEFT EYE PAIN /DISCOMFORT ONSET LAST NIGHT DENIES INJURY, PT. STATES HE RAN OUT OF HIS ACTOS AND LISINOPRIL FOR 5 DAYS . NO BLURRED VISION .

## 2012-10-09 ENCOUNTER — Encounter (HOSPITAL_COMMUNITY): Payer: Self-pay

## 2012-10-09 ENCOUNTER — Emergency Department (HOSPITAL_COMMUNITY)
Admission: EM | Admit: 2012-10-09 | Discharge: 2012-10-09 | Disposition: A | Discharge status: Home / Self Care | Payer: Self-pay | Attending: Emergency Medicine | Admitting: Emergency Medicine

## 2012-10-09 DIAGNOSIS — E119 Type 2 diabetes mellitus without complications: Secondary | ICD-10-CM | POA: Insufficient documentation

## 2012-10-09 DIAGNOSIS — I10 Essential (primary) hypertension: Secondary | ICD-10-CM | POA: Insufficient documentation

## 2012-10-09 DIAGNOSIS — R6889 Other general symptoms and signs: Secondary | ICD-10-CM | POA: Insufficient documentation

## 2012-10-09 DIAGNOSIS — Z79899 Other long term (current) drug therapy: Secondary | ICD-10-CM | POA: Insufficient documentation

## 2012-10-09 DIAGNOSIS — H579 Unspecified disorder of eye and adnexa: Secondary | ICD-10-CM | POA: Insufficient documentation

## 2012-10-09 DIAGNOSIS — E7800 Pure hypercholesterolemia, unspecified: Secondary | ICD-10-CM | POA: Insufficient documentation

## 2012-10-09 MED ORDER — TETRACAINE HCL 0.5 % OP SOLN
1.0000 [drp] | Freq: Once | OPHTHALMIC | Status: AC
  Administered 2012-10-09: 1 [drp] via OPHTHALMIC
  Filled 2012-10-09: qty 2

## 2012-10-09 MED ORDER — FLUORESCEIN SODIUM 1 MG OP STRP
1.0000 | ORAL_STRIP | Freq: Once | OPHTHALMIC | Status: AC
  Administered 2012-10-09: 1 via OPHTHALMIC
  Filled 2012-10-09: qty 1

## 2012-10-09 MED ORDER — ERYTHROMYCIN 5 MG/GM OP OINT: TOPICAL_OINTMENT | OPHTHALMIC | Status: DC

## 2012-10-09 NOTE — ED Notes (Signed)
Pt A.O x 4. Vitals stable. Respirations even and unlabored. Skin warm, dry, and intact. NAD. Reports pain 2/10. Verbalized understanding of diagnosis. Verbalized understanding of Rx and the cream application. No further questions at this time.

## 2012-10-09 NOTE — ED Notes (Signed)
Pt reports a piece of wood went in his left eye 5 days ago. States "the wood was on my towel when I wiped my face. I asked my wife to blow in my eye." Reports heaviness in eye, mild tearing, and pain. 3/10.

## 2012-10-09 NOTE — ED Provider Notes (Signed)
Medical screening examination/treatment/procedure(s) were performed by non-physician practitioner and as supervising physician I was immediately available for consultation/collaboration.  Drury Ardizzone M Maitland Lesiak, MD 10/09/12 0644 
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
## 2013-03-14 ENCOUNTER — Encounter (HOSPITAL_COMMUNITY): Payer: Self-pay | Admitting: Emergency Medicine

## 2013-03-14 ENCOUNTER — Emergency Department (HOSPITAL_COMMUNITY)
Admission: EM | Admit: 2013-03-14 | Discharge: 2013-03-14 | Disposition: A | Discharge status: Home / Self Care | Payer: Self-pay | Attending: Emergency Medicine | Admitting: Emergency Medicine

## 2013-03-14 DIAGNOSIS — R599 Enlarged lymph nodes, unspecified: Secondary | ICD-10-CM | POA: Insufficient documentation

## 2013-03-14 DIAGNOSIS — Y9389 Activity, other specified: Secondary | ICD-10-CM | POA: Insufficient documentation

## 2013-03-14 DIAGNOSIS — X19XXXA Contact with other heat and hot substances, initial encounter: Secondary | ICD-10-CM | POA: Insufficient documentation

## 2013-03-14 DIAGNOSIS — T280XXA Burn of mouth and pharynx, initial encounter: Secondary | ICD-10-CM | POA: Insufficient documentation

## 2013-03-14 DIAGNOSIS — K1370 Unspecified lesions of oral mucosa: Secondary | ICD-10-CM | POA: Insufficient documentation

## 2013-03-14 DIAGNOSIS — Z8639 Personal history of other endocrine, nutritional and metabolic disease: Secondary | ICD-10-CM | POA: Insufficient documentation

## 2013-03-14 DIAGNOSIS — Y929 Unspecified place or not applicable: Secondary | ICD-10-CM | POA: Insufficient documentation

## 2013-03-14 DIAGNOSIS — Z79899 Other long term (current) drug therapy: Secondary | ICD-10-CM | POA: Insufficient documentation

## 2013-03-14 DIAGNOSIS — E1169 Type 2 diabetes mellitus with other specified complication: Secondary | ICD-10-CM | POA: Insufficient documentation

## 2013-03-14 DIAGNOSIS — I10 Essential (primary) hypertension: Secondary | ICD-10-CM | POA: Insufficient documentation

## 2013-03-14 DIAGNOSIS — R6883 Chills (without fever): Secondary | ICD-10-CM | POA: Insufficient documentation

## 2013-03-14 DIAGNOSIS — Z7982 Long term (current) use of aspirin: Secondary | ICD-10-CM | POA: Insufficient documentation

## 2013-03-14 DIAGNOSIS — Z862 Personal history of diseases of the blood and blood-forming organs and certain disorders involving the immune mechanism: Secondary | ICD-10-CM | POA: Insufficient documentation

## 2013-03-14 LAB — GLUCOSE, CAPILLARY: Glucose-Capillary: 381 mg/dL — ABNORMAL HIGH (ref 70–99)

## 2013-03-14 MED ORDER — PENICILLIN V POTASSIUM 500 MG PO TABS: 500.0000 mg | ORAL_TABLET | Freq: Three times a day (TID) | ORAL | Status: DC

## 2013-03-14 MED ORDER — OXYCODONE-ACETAMINOPHEN 5-325 MG PO TABS: 1.0000 | ORAL_TABLET | ORAL | Status: DC | PRN
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
## 2013-03-14 NOTE — ED Notes (Signed)
MD at bedside. 
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
## 2013-03-14 NOTE — ED Provider Notes (Signed)
Medical screening examination/treatment/procedure(s) were performed by non-physician practitioner and as supervising physician I was immediately available for consultation/collaboration.  Dione Booze, MD 03/14/13 [phone]

## 2013-06-04 ENCOUNTER — Emergency Department (HOSPITAL_COMMUNITY)
Admission: EM | Admit: 2013-06-04 | Discharge: 2013-06-04 | Disposition: A | Discharge status: Home / Self Care | Payer: Self-pay | Attending: Emergency Medicine | Admitting: Emergency Medicine

## 2013-06-04 DIAGNOSIS — E1169 Type 2 diabetes mellitus with other specified complication: Secondary | ICD-10-CM | POA: Insufficient documentation

## 2013-06-04 DIAGNOSIS — R739 Hyperglycemia, unspecified: Secondary | ICD-10-CM

## 2013-06-04 DIAGNOSIS — Z79899 Other long term (current) drug therapy: Secondary | ICD-10-CM | POA: Insufficient documentation

## 2013-06-04 DIAGNOSIS — H538 Other visual disturbances: Secondary | ICD-10-CM | POA: Insufficient documentation

## 2013-06-04 DIAGNOSIS — Z8639 Personal history of other endocrine, nutritional and metabolic disease: Secondary | ICD-10-CM | POA: Insufficient documentation

## 2013-06-04 DIAGNOSIS — I10 Essential (primary) hypertension: Secondary | ICD-10-CM | POA: Insufficient documentation

## 2013-06-04 DIAGNOSIS — H539 Unspecified visual disturbance: Secondary | ICD-10-CM

## 2013-06-04 DIAGNOSIS — Z862 Personal history of diseases of the blood and blood-forming organs and certain disorders involving the immune mechanism: Secondary | ICD-10-CM | POA: Insufficient documentation

## 2013-06-04 LAB — GLUCOSE, CAPILLARY: Glucose-Capillary: 238 mg/dL — ABNORMAL HIGH (ref 70–99)

## 2013-06-04 MED ORDER — FLUORESCEIN SODIUM 1 MG OP STRP
1.0000 | ORAL_STRIP | Freq: Once | OPHTHALMIC | Status: DC
  Filled 2013-06-04: qty 1

## 2013-06-04 MED ORDER — TETRACAINE HCL 0.5 % OP SOLN
2.0000 [drp] | Freq: Once | OPHTHALMIC | Status: DC
  Filled 2013-06-04: qty 2

## 2013-06-04 NOTE — ED Notes (Addendum)
Pt. Having eye pain and decreased vision for over 2 weeks.  Also reports having a headache behind his lt eye.   He describes the pain as sharp and it is deep in the eye.

## 2013-06-04 NOTE — ED Provider Notes (Signed)
CSN: [id number]     Arrival date & time 06/04/13  4098 History     First MD Initiated Contact with Patient 06/04/13 1013     Chief Complaint  Patient presents with  . Eye Pain   (Consider location/radiation/quality/duration/timing/severity/associated sxs/prior Treatment) HPI Chase Mccormick is a 47 year old male presented to the emergency department today with complaint of left eye pain and blurred vision. Patient states he has had constant decreased vision and left eye for about 10 days. Patient states that he is having intermittent pain in the left eye lasting seconds to minutes that comes into [place]. Patient reports vision is blurred denies any changes in visual fields , denies any injuries, denies any flashing lights. Patient also denies any injuries to the eye. Patient states the pain feels like it is in the eyeball and radiates into the head. 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 Unsure of exact cause of his blindness. She states he wears glasses but has not seen his eye doctor in the "a while". Patient states he is diabetic and is unsure what his blood sugars are Past Medical History  Diagnosis Date  . Diabetes mellitus   . Hypertension   . High cholesterol    No past surgical history on file. Family History  Problem Relation Age of Onset  . Ulcers Mother    History  Substance Use Topics  . Smoking status: Never Smoker   . Smokeless tobacco: Never Used  . Alcohol Use: No     Comment: stopped 3 years ago    Review of Systems  Constitutional: Negative for fever and chills.  HENT: Negative for neck pain and neck stiffness.   Eyes: Positive for pain and visual disturbance. Negative for photophobia, discharge, redness and itching.  Respiratory: Negative.   Cardiovascular: Negative.   Neurological: Negative for dizziness, light-headedness and headaches.    Allergies  Review of patient's allergies indicates no known allergies.  Home Medications   Current  Outpatient Rx  Name  Route  Sig  Dispense  Refill  . Aspirin-Salicylamide-Caffeine (BC HEADACHE PO)   Oral   Take 1 Package by mouth daily as needed (for pain).         Marland Kitchen erythromycin ophthalmic ointment      Place a 1/2 inch ribbon of ointment into the lower eyelid.   3.5 g   0   . GLIPIZIDE PO   Oral   Take 1 tablet by mouth daily.         Marland Kitchen lisinopril (PRINIVIL,ZESTRIL) 10 MG tablet   Oral   Take 1 tablet (10 mg total) by mouth daily.   90 tablet   0   . metFORMIN (GLUCOPHAGE) 1000 MG tablet   Oral   Take 1 tablet (1,000 mg total) by mouth 2 (two) times daily with a meal.   180 tablet   0   . oxyCODONE-acetaminophen (PERCOCET/ROXICET) 5-325 MG per tablet   Oral   Take 1 tablet by mouth every 4 (four) hours as needed for pain.   12 tablet   0   . penicillin v potassium (VEETID) 500 MG tablet   Oral   Take 1 tablet (500 mg total) by mouth 3 (three) times daily.   30 tablet   0   . pioglitazone (ACTOS) 15 MG tablet   Oral   Take 1 tablet (15 mg total) by mouth daily.   90 tablet   0    BP 130/80  Pulse 65  Temp(Src) 97.4 F (36.3 C) (Oral)  Resp 20  SpO2 99% Physical Exam  Nursing note and vitals reviewed. Constitutional: He appears well-developed and well-nourished. No distress.  HENT:  Head: Normocephalic and atraumatic.  Eyes: Conjunctivae are normal. Pupils are equal, round, and reactive to light. Lids are everted and swept, no foreign bodies found. Right eye exhibits normal extraocular motion. Left eye exhibits normal extraocular motion.  Slit lamp exam:      The left eye shows no corneal abrasion, no corneal ulcer, no hyphema, no hypopyon and no fluorescein uptake.  Neck: Neck supple.  Cardiovascular: Normal rate, regular rhythm and normal heart sounds.   Pulmonary/Chest: Effort normal. No respiratory distress. He has no wheezes. He has no rales.  Abdominal: Soft. Bowel sounds are normal. He exhibits no distension. There is no tenderness.  There is no rebound.  Musculoskeletal: He exhibits no edema.  Neurological: He is alert.  Skin: Skin is warm and dry.    ED Course   Procedures (including critical care time)  Labs Reviewed  GLUCOSE, CAPILLARY - Abnormal; Notable for the following:    Glucose-Capillary 238 (*)    All other components within normal limits   No results found.   1. Visual changes   2. Hyperglycemia     MDM  Patient is a 47 year old diabetic who presented to emergency department with a new onset of blurred vision in the left eye for the last 10 days. patient's exam is unremarkable. Patient is blind in the right eye from childhood. Left eye visual acuity is 20/70 today. His fluorescein stain is negative. His pressure in the left eye is around 20. Blood sugar in the emergency department today is 238. Patient is taking metformin, glipizide, and Actos for his diabetes. I have discussed patient with Dr. Gwen Pounds ophthalmology, and he will see him in the office at 12:30 on Monday. Do not think any further margin treatment or testing is indicated at this time. Patient instructed to return to emergency Department if vision is worsening or any new concerning symptoms.  Filed Vitals:   06/04/13 0953 06/04/13 1024  BP: 132/76 130/80  Pulse: 63 65  Temp: 98.3 F (36.8 C) 97.4 F (36.3 C)  TempSrc: Oral Oral  Resp: 16 20  SpO2: 98% 99%     Lottie Mussel, PA-C 06/04/13 1510

## 2013-06-04 NOTE — ED Notes (Signed)
Pontocaine and fluorescin at bedside

## 2013-06-05 NOTE — ED Provider Notes (Signed)
Medical screening examination/treatment/procedure(s) were performed by non-physician practitioner and as supervising physician I was immediately available for consultation/collaboration.   Lavenia Stumpo, MD 06/05/13 1226 

## 2013-07-10 ENCOUNTER — Encounter (HOSPITAL_COMMUNITY): Payer: Self-pay | Admitting: Emergency Medicine

## 2013-07-10 ENCOUNTER — Emergency Department (HOSPITAL_COMMUNITY)
Admission: EM | Admit: 2013-07-10 | Discharge: 2013-07-10 | Disposition: A | Discharge status: Home / Self Care | Payer: Self-pay | Attending: Emergency Medicine | Admitting: Emergency Medicine

## 2013-07-10 DIAGNOSIS — I10 Essential (primary) hypertension: Secondary | ICD-10-CM | POA: Insufficient documentation

## 2013-07-10 DIAGNOSIS — H9209 Otalgia, unspecified ear: Secondary | ICD-10-CM | POA: Insufficient documentation

## 2013-07-10 DIAGNOSIS — H9201 Otalgia, right ear: Secondary | ICD-10-CM

## 2013-07-10 DIAGNOSIS — Z79899 Other long term (current) drug therapy: Secondary | ICD-10-CM | POA: Insufficient documentation

## 2013-07-10 DIAGNOSIS — E7800 Pure hypercholesterolemia, unspecified: Secondary | ICD-10-CM | POA: Insufficient documentation

## 2013-07-10 DIAGNOSIS — E119 Type 2 diabetes mellitus without complications: Secondary | ICD-10-CM | POA: Insufficient documentation

## 2013-07-10 NOTE — ED Provider Notes (Signed)
Medical screening examination/treatment/procedure(s) were performed by non-physician practitioner and as supervising physician I was immediately available for consultation/collaboration.   Shanna Cisco, MD 07/10/13 [phone]

## 2013-07-10 NOTE — ED Notes (Signed)
Pt. Stated, i started having rt. Ear pain this morning.

## 2013-07-10 NOTE — ED Provider Notes (Signed)
CSN: [id number]     Arrival date & time 07/10/13  1022 History  This chart was scribed for non-physician practitioner Junius Finner, PA-C, working with Shanna Cisco, MD by Dorothey Baseman, ED Scribe. This patient was seen in room TR06C/TR06C and the patient's care was started at 11:26 AM.   Chief Complaint  Patient presents with  . Otalgia   The history is provided by the patient. No language interpreter was used.   HPI Comments: Chase Mccormick is a 47 y.o. male who presents to the Emergency Department complaining of intermittent right ear pain onset this morning, around 5.5 hours ago, when he woke up. He states that he believes something may be retained in the ear because he sleeps in his vehicle and reports that he thinks he can feel something moving inside of the ear. He denies taking any medications at home to treat the pain. He denies any drainage from the ear, hearing changes, sore throat, mouth pain, or any other symptoms at this time. He denies any allergies to medications.   Past Medical History  Diagnosis Date  . Diabetes mellitus   . Hypertension   . High cholesterol    History reviewed. No pertinent past surgical history. Family History  Problem Relation Age of Onset  . Ulcers Mother    History  Substance Use Topics  . Smoking status: Never Smoker   . Smokeless tobacco: Never Used  . Alcohol Use: No     Comment: stopped 3 years ago    Review of Systems  HENT: Positive for ear pain. Negative for hearing loss, sore throat and ear discharge.   All other systems reviewed and are negative.    Allergies  Review of patient's allergies indicates no known allergies.  Home Medications   Current Outpatient Rx  Name  Route  Sig  Dispense  Refill  . glipiZIDE (GLUCOTROL) 10 MG tablet   Oral   Take 10 mg by mouth 2 (two) times daily before a meal.         . lisinopril (PRINIVIL,ZESTRIL) 10 MG tablet   Oral   Take 1 tablet (10 mg total) by mouth daily.   90  tablet   0   . metFORMIN (GLUCOPHAGE) 1000 MG tablet   Oral   Take 1 tablet (1,000 mg total) by mouth 2 (two) times daily with a meal.   180 tablet   0   . pioglitazone (ACTOS) 15 MG tablet   Oral   Take 1 tablet (15 mg total) by mouth daily.   90 tablet   0   . pravastatin (PRAVACHOL) 10 MG tablet   Oral   Take 10 mg by mouth daily.          Triage Vitals: BP 144/82  Pulse 82  Temp(Src) 97 F (36.1 C) (Oral)  Resp 15  SpO2 98%  Physical Exam  Nursing note and vitals reviewed. Constitutional: He is oriented to person, place, and time. He appears well-developed and well-nourished.  HENT:  Head: Normocephalic and atraumatic.  Right Ear: Hearing, tympanic membrane, external ear and ear canal normal.  Left Ear: Hearing, tympanic membrane, external ear and ear canal normal.  Nose: Nose normal.  Mouth/Throat: Uvula is midline, oropharynx is clear and moist and mucous membranes are normal.  No foreign body detected. No erythema. Normal TM bilaterally.   Eyes: EOM are normal.  Neck: Normal range of motion.  Cardiovascular: Normal rate, regular rhythm and normal heart sounds.   Pulmonary/Chest: Effort  normal and breath sounds normal. No respiratory distress.  Musculoskeletal: Normal range of motion.  Neurological: He is alert and oriented to person, place, and time.  Skin: Skin is warm and dry.  Psychiatric: He has a normal mood and affect. His behavior is normal.    ED Course  Procedures (including critical care time)  DIAGNOSTIC STUDIES: Oxygen Saturation is 98% on room air, normal by my interpretation.    COORDINATION OF CARE: 11:29AM- Will order a flushing of the right ear. Discussed treatment plan with patient at bedside and patient verbalized agreement.     Labs Review Labs Reviewed - No data to display Imaging Review No results found.  MDM   1. Right ear pain    Pt c/o right ear pain and "something moving inside" his right ear.  PE-unremarkable. Ear  canals clear, no erythema or foreign bodies. Nl TM bilaterally.  Has RN flush pt's ear. No foreign body was seen.  Reassured pt his ears look fine and nothing was inside. Return precautions provided. All labs/imaging/findings discussed with patient. All questions answered, and concerns addressed. Pt verbalized understanding and agreement with tx plan   I personally performed the services described in this documentation, which was scribed in my presence. The recorded information has been reviewed and is accurate.   Junius Finner, PA-C 07/10/13 [phone]

## 2013-11-29 ENCOUNTER — Emergency Department (HOSPITAL_COMMUNITY)
Admission: EM | Admit: 2013-11-29 | Discharge: 2013-11-29 | Disposition: A | Discharge status: Home / Self Care | Payer: Self-pay | Attending: Emergency Medicine | Admitting: Emergency Medicine

## 2013-11-29 ENCOUNTER — Encounter (HOSPITAL_COMMUNITY): Payer: Self-pay | Admitting: Emergency Medicine

## 2013-11-29 DIAGNOSIS — H538 Other visual disturbances: Secondary | ICD-10-CM | POA: Insufficient documentation

## 2013-11-29 DIAGNOSIS — M545 Low back pain, unspecified: Secondary | ICD-10-CM | POA: Insufficient documentation

## 2013-11-29 DIAGNOSIS — M791 Myalgia, unspecified site: Secondary | ICD-10-CM

## 2013-11-29 DIAGNOSIS — I10 Essential (primary) hypertension: Secondary | ICD-10-CM | POA: Insufficient documentation

## 2013-11-29 DIAGNOSIS — IMO0001 Reserved for inherently not codable concepts without codable children: Secondary | ICD-10-CM | POA: Insufficient documentation

## 2013-11-29 DIAGNOSIS — Z79899 Other long term (current) drug therapy: Secondary | ICD-10-CM | POA: Insufficient documentation

## 2013-11-29 DIAGNOSIS — E7800 Pure hypercholesterolemia, unspecified: Secondary | ICD-10-CM | POA: Insufficient documentation

## 2013-11-29 DIAGNOSIS — E119 Type 2 diabetes mellitus without complications: Secondary | ICD-10-CM | POA: Insufficient documentation

## 2013-11-29 LAB — COMPREHENSIVE METABOLIC PANEL
ALT: 29 U/L (ref 0–53)
AST: 21 U/L (ref 0–37)
Albumin: 4.1 g/dL (ref 3.5–5.2)
Alkaline Phosphatase: 59 U/L (ref 39–117)
BILIRUBIN TOTAL: 0.4 mg/dL (ref 0.3–1.2)
BUN: 11 mg/dL (ref 6–23)
CALCIUM: 9.5 mg/dL (ref 8.4–10.5)
CO2: 28 meq/L (ref 19–32)
CREATININE: 0.68 mg/dL (ref 0.50–1.35)
Chloride: 102 mEq/L (ref 96–112)
GLUCOSE: 157 mg/dL — AB (ref 70–99)
Potassium: 4.5 mEq/L (ref 3.7–5.3)
Sodium: 142 mEq/L (ref 137–147)
Total Protein: 7.9 g/dL (ref 6.0–8.3)

## 2013-11-29 LAB — URINALYSIS, ROUTINE W REFLEX MICROSCOPIC
Bilirubin Urine: NEGATIVE
Glucose, UA: NEGATIVE mg/dL
HGB URINE DIPSTICK: NEGATIVE
KETONES UR: NEGATIVE mg/dL
Leukocytes, UA: NEGATIVE
Nitrite: NEGATIVE
PROTEIN: NEGATIVE mg/dL
Specific Gravity, Urine: 1.013 (ref 1.005–1.030)
UROBILINOGEN UA: 0.2 mg/dL (ref 0.0–1.0)
pH: 6 (ref 5.0–8.0)

## 2013-11-29 LAB — CBC WITH DIFFERENTIAL/PLATELET
Basophils Absolute: 0 10*3/uL (ref 0.0–0.1)
Basophils Relative: 0 % (ref 0–1)
EOS PCT: 1 % (ref 0–5)
Eosinophils Absolute: 0.1 10*3/uL (ref 0.0–0.7)
HEMATOCRIT: 43.6 % (ref 39.0–52.0)
HEMOGLOBIN: 15 g/dL (ref 13.0–17.0)
LYMPHS ABS: 1.3 10*3/uL (ref 0.7–4.0)
LYMPHS PCT: 38 % (ref 12–46)
MCH: 28.8 pg (ref 26.0–34.0)
MCHC: 34.4 g/dL (ref 30.0–36.0)
MCV: 83.8 fL (ref 78.0–100.0)
MONO ABS: 0.3 10*3/uL (ref 0.1–1.0)
Monocytes Relative: 10 % (ref 3–12)
Neutro Abs: 1.7 10*3/uL (ref 1.7–7.7)
Neutrophils Relative %: 50 % (ref 43–77)
Platelets: 249 10*3/uL (ref 150–400)
RBC: 5.2 MIL/uL (ref 4.22–5.81)
RDW: 12.5 % (ref 11.5–15.5)
WBC: 3.5 10*3/uL — AB (ref 4.0–10.5)

## 2013-11-29 LAB — GLUCOSE, CAPILLARY: Glucose-Capillary: 139 mg/dL — ABNORMAL HIGH (ref 70–99)

## 2013-11-29 NOTE — Discharge Instructions (Signed)
1. Medications: usual home medications 2. Treatment: rest, drink plenty of fluids, ibuprofen for myalgias and low back pain; try to add padding to the floor to help your back  3. Follow Up: Please followup with your primary doctor for discussion of your diagnoses and further evaluation after today's visit; if you do not have a primary care doctor use the resource guide provided to find one;     Back Exercises Back exercises help treat and prevent back injuries. The goal of back exercises is to increase the strength of your abdominal and back muscles and the flexibility of your back. These exercises should be started when you no longer have back pain. Back exercises include:  Pelvic Tilt. Lie on your back with your knees bent. Tilt your pelvis until the lower part of your back is against the floor. Hold this position 5 to 10 sec and repeat 5 to 10 times.  Knee to Chest. Pull first 1 knee up against your chest and hold for 20 to 30 seconds, repeat this with the other knee, and then both knees. This may be done with the other leg straight or bent, whichever feels better.  Sit-Ups or Curl-Ups. Bend your knees 90 degrees. Start with tilting your pelvis, and do a partial, slow sit-up, lifting your trunk only 30 to 45 degrees off the floor. Take at least 2 to 3 seconds for each sit-up. Do not do sit-ups with your knees out straight. If partial sit-ups are difficult, simply do the above but with only tightening your abdominal muscles and holding it as directed.  Hip-Lift. Lie on your back with your knees flexed 90 degrees. Push down with your feet and shoulders as you raise your hips a couple inches off the floor; hold for 10 seconds, repeat 5 to 10 times.  Back arches. Lie on your stomach, propping yourself up on bent elbows. Slowly press on your hands, causing an arch in your low back. Repeat 3 to 5 times. Any initial stiffness and discomfort should lessen with repetition over time.  Shoulder-Lifts. Lie  face down with arms beside your body. Keep hips and torso pressed to floor as you slowly lift your head and shoulders off the floor. Do not overdo your exercises, especially in the beginning. Exercises may cause you some mild back discomfort which lasts for a few minutes; however, if the pain is more severe, or lasts for more than 15 minutes, do not continue exercises until you see your caregiver. Improvement with exercise therapy for back problems is slow.  See your caregivers for assistance with developing a proper back exercise program. Document Released: 11/13/2004 Document Revised: 12/29/2011 Document Reviewed: 08/07/2011 [facility] Patient Information 2014 [place], [place].    Viral Infections A viral infection can be caused by different types of viruses.Most viral infections are not serious and resolve on their own. However, some infections may cause severe symptoms and may lead to further complications. SYMPTOMS Viruses can frequently cause:  Minor sore throat.  Aches and pains.  Headaches.  Runny nose.  Different types of rashes.  Watery eyes.  Tiredness.  Cough.  Loss of appetite.  Gastrointestinal infections, resulting in nausea, vomiting, and diarrhea. These symptoms do not respond to antibiotics because the infection is not caused by bacteria. However, you might catch a bacterial infection following the viral infection. This is sometimes called a "superinfection." Symptoms of such a bacterial infection may include:  Worsening sore throat with pus and difficulty swallowing.  Swollen neck glands.  Chills  and a high or persistent fever.  Severe headache.  Tenderness over the sinuses.  Persistent overall ill feeling (malaise), muscle aches, and tiredness (fatigue).  Persistent cough.  Yellow, green, or brown mucus production with coughing. HOME CARE INSTRUCTIONS   Only take over-the-counter or prescription medicines for pain, discomfort, diarrhea, or fever as  directed by your caregiver.  Drink enough water and fluids to keep your urine clear or pale yellow. Sports drinks can provide valuable electrolytes, sugars, and hydration.  Get plenty of rest and maintain proper nutrition. Soups and broths with crackers or rice are fine. SEEK IMMEDIATE MEDICAL CARE IF:   You have severe headaches, shortness of breath, chest pain, neck pain, or an unusual rash.  You have uncontrolled vomiting, diarrhea, or you are unable to keep down fluids.  You or your child has an oral temperature above 102 F (38.9 C), not controlled by medicine.  Your baby is older than 3 months with a rectal temperature of 102 F (38.9 C) or higher.  Your baby is 483 months old or younger with a rectal temperature of 100.4 F (38 C) or higher. MAKE SURE YOU:   Understand these instructions.  Will watch your condition.  Will get help right away if you are not doing well or get worse. Document Released: 07/16/2005 Document Revised: 12/29/2011 Document Reviewed: 02/10/2011 [facility] Patient Information 2014 [place]

## 2013-11-29 NOTE — ED Notes (Signed)
Fever  Aching allover goes and comes  For 1 week  Has a h/a

## 2013-11-29 NOTE — ED Notes (Signed)
Cyndia SkeetersHannah M. PA at bedside

## 2013-11-29 NOTE — ED Provider Notes (Signed)
CSN: [id number]     Arrival date & time 11/29/13  1239 History   First MD Initiated Contact with Patient 11/29/13 1533     Chief Complaint  Patient presents with  . Fever     (Consider location/radiation/quality/duration/timing/severity/associated sxs/prior Treatment) Patient is a 48 y.o. male presenting with fever. The history is provided by the patient and medical records. No language interpreter was used.  Fever Associated symptoms: myalgias   Associated symptoms: no chest pain, no cough, no diarrhea, no dysuria, no headaches, no nausea, no rash and no vomiting     Chase Mccormick is a 48 y.o. male  with a hx of NIDDM, HTN, high cholesterol presents to the Emergency Department complaining of gradual, intermittent generalized body aching onset 2 weeks ago. Associated symptoms include low back pain, intermittent subjective (unmeasured) fevers, a "little bit of cough" beginning yesterday and mild, generalized, intermittent headache.  Pt reports taking ibuprofen intermittently which helps with the symptoms every time.  Nothing makes it worse.  Patient reports he moved to 3 months ago and has been sleeping on the floor ever since.  He endorses low back pain ever since he began sleeping on the floor which is also intermittent. He denies saddle anesthesia, numbness, tingling, loss of bowel or bladder control, difficulty ambulating or radiation of pain. He currently denies all pain.  He denies recent travel.  Pt also denies chills, neck pain, neck stiffness, chest pain, shortness of breath, abdominal pain, nausea, vomiting, diarrhea, weakness, dizziness, syncope, dysuria, hematuria.  Patient relates a vague history of blurred vision worse when he's not using his glasses for an unknown amount of time.  Patient states that it has "been a while" since he saw an ophthalmologist and did not followup after his last visit to the ER for eye pain.  He has a history of blindness in his right eye.  Patient  denies that his vision changes or ever associated with his headaches.   Past Medical History  Diagnosis Date  . Diabetes mellitus   . Hypertension   . High cholesterol    History reviewed. No pertinent past surgical history. Family History  Problem Relation Age of Onset  . Ulcers Mother    History  Substance Use Topics  . Smoking status: Never Smoker   . Smokeless tobacco: Never Used  . Alcohol Use: No     Comment: stopped 3 years ago    Review of Systems  Constitutional: Positive for fever. Negative for diaphoresis, appetite change, fatigue and unexpected weight change.  HENT: Negative for mouth sores.   Eyes: Positive for visual disturbance.  Respiratory: Negative for cough, chest tightness, shortness of breath and wheezing.   Cardiovascular: Negative for chest pain.  Gastrointestinal: Negative for nausea, vomiting, abdominal pain, diarrhea and constipation.  Endocrine: Negative for polydipsia, polyphagia and polyuria.  Genitourinary: Negative for dysuria, urgency, frequency and hematuria.  Musculoskeletal: Positive for back pain (low) and myalgias. Negative for neck stiffness.  Skin: Negative for rash.  Allergic/Immunologic: Negative for immunocompromised state.  Neurological: Negative for syncope, light-headedness and headaches.  Hematological: Does not bruise/bleed easily.  Psychiatric/Behavioral: Negative for sleep disturbance. The patient is not nervous/anxious.       Allergies  Review of patient's allergies indicates no known allergies.  Home Medications   Current Outpatient Rx  Name  Route  Sig  Dispense  Refill  . glipiZIDE (GLUCOTROL) 10 MG tablet   Oral   Take 10 mg by mouth 2 (two) times daily before a  meal.         . lisinopril (PRINIVIL,ZESTRIL) 10 MG tablet   Oral   Take 1 tablet (10 mg total) by mouth daily.   90 tablet   0   . metFORMIN (GLUCOPHAGE) 1000 MG tablet   Oral   Take 1 tablet (1,000 mg total) by mouth 2 (two) times daily with  a meal.   180 tablet   0   . pioglitazone (ACTOS) 15 MG tablet   Oral   Take 1 tablet (15 mg total) by mouth daily.   90 tablet   0   . pravastatin (PRAVACHOL) 10 MG tablet   Oral   Take 10 mg by mouth daily.          BP 140/85  Pulse 67  Temp(Src) 97.8 F (36.6 C) (Oral)  Resp 16  SpO2 98% Physical Exam  Nursing note and vitals reviewed. Constitutional: He is oriented to person, place, and time. He appears well-developed and well-nourished. No distress.  HENT:  Head: Normocephalic and atraumatic.  Right Ear: Tympanic membrane, external ear and ear canal normal.  Left Ear: Tympanic membrane, external ear and ear canal normal.  Nose: Nose normal. No mucosal edema or rhinorrhea. No epistaxis. Right sinus exhibits no maxillary sinus tenderness and no frontal sinus tenderness. Left sinus exhibits no maxillary sinus tenderness and no frontal sinus tenderness.  Mouth/Throat: Uvula is midline, oropharynx is clear and moist and mucous membranes are normal. Mucous membranes are not pale and not cyanotic. No oropharyngeal exudate, posterior oropharyngeal edema, posterior oropharyngeal erythema or tonsillar abscesses.  Eyes: Conjunctivae and EOM are normal. Pupils are equal, round, and reactive to light. Right eye exhibits no chemosis, no discharge and no exudate. Left eye exhibits no chemosis, no discharge and no exudate. No scleral icterus.  Visual Acuity (corrected): R - Blind L - near - 20/20 L - far - 20/20  Neck: Normal range of motion and full passive range of motion without pain. Neck supple.  Full ROM without pain  Cardiovascular: Normal rate, regular rhythm, normal heart sounds and intact distal pulses.   No murmur heard. Pulmonary/Chest: Effort normal and breath sounds normal. No stridor. No respiratory distress. He has no wheezes. He has no rales.  Abdominal: Soft. Bowel sounds are normal. He exhibits no distension. There is no tenderness. There is no rebound and no  guarding.  Musculoskeletal: Normal range of motion.  Full range of motion of the T-spine and L-spine No tenderness to palpation of the spinous processes of the T-spine or L-spine No tenderness to palpation of the paraspinous muscles of the L-spine  Lymphadenopathy:    He has no cervical adenopathy.  Neurological: He is alert and oriented to person, place, and time. He has normal reflexes. No cranial nerve deficit. He exhibits normal muscle tone. Coordination normal.  Speech is clear and goal oriented, follows commands Cranial nerves III - XII without deficit, no facial droop Normal strength in upper and lower extremities bilaterally, strong and equal grip strength Sensation normal to light and sharp touch Moves extremities without ataxia, coordination intact Normal finger to nose and rapid alternating movements Neg romberg, no pronator drift Normal gait Normal heel-shin and balance   Skin: Skin is warm and dry. No rash noted. He is not diaphoretic. No erythema.  Psychiatric: He has a normal mood and affect. His behavior is normal. Judgment and thought content normal.    ED Course  Procedures (including critical care time) Labs Review Labs Reviewed  CBC  WITH DIFFERENTIAL - Abnormal; Notable for the following:    WBC 3.5 (*)    All other components within normal limits  COMPREHENSIVE METABOLIC PANEL - Abnormal; Notable for the following:    Glucose, Bld 157 (*)    All other components within normal limits  GLUCOSE, CAPILLARY - Abnormal; Notable for the following:    Glucose-Capillary 139 (*)    All other components within normal limits  URINALYSIS, ROUTINE W REFLEX MICROSCOPIC - Abnormal; Notable for the following:    APPearance HAZY (*)    All other components within normal limits   Imaging Review No results found.  EKG Interpretation   None       MDM   Final diagnoses:  Myalgia  Low back pain    Chase Mccormick  presents with vague complaints including  myalgias, low back pain and intermittent subjective fevers. Patient with normal neurologic exam, nontoxic and nonseptic, NAD and denying current symptoms.  Patient with non-insulin-dependent diabetes but no evidence of DKA and anion gap of 12.  Current blood sugar 139.  Pt without pain to his eyes and no injection or erythema. No swelling of the lids or surrounding tissue.  No concern for iritis or corneal abrasion.  Patient does not wear contacts.  Visual acuity normal in the left eye.    5:43 PM  Patient with low back pain for some time.  No neurological deficits and normal neuro exam.  UA without evidence of urinary tract infection. Patient ambulates in the department with ease and denies pain.  No loss of bowel or bladder control.  No concern for cauda equina.  No night sweats, weight loss, h/o cancer, IVDU.   Patient with nonspecific symptoms likely secondary to a viral syndrome.  He's afebrile, non-tachycardic, nonseptic nontoxic appearing here in the emergency department. Today he is without complaint and reporting that when he has symptoms they're completely managed with ibuprofen.  Patient reports he has primary care appointment scheduled in 12 days.  He agrees to followup earlier if symptoms return.  It has been determined that no acute conditions requiring further emergency intervention are present at this time. The patient/guardian have been advised of the diagnosis and plan. We have discussed signs and symptoms that warrant return to the ED, such as changes or worsening in symptoms.   Vital signs are stable at discharge.   BP 140/85  Pulse 67  Temp(Src) 97.8 F (36.6 C) (Oral)  Resp 16  SpO2 98%  Patient/guardian has voiced understanding and agreed to follow-up with the PCP or specialist.      Dierdre ForthHannah Irmalee Riemenschneider, PA-C 11/29/13 1750

## 2013-11-30 NOTE — ED Provider Notes (Signed)
Medical screening examination/treatment/procedure(s) were performed by non-physician practitioner and as supervising physician I was immediately available for consultation/collaboration.  EKG Interpretation   None         Audree CamelScott T Jonia Oakey, MD 11/30/13 1030

## 2014-05-30 ENCOUNTER — Emergency Department (HOSPITAL_COMMUNITY)
Admission: EM | Admit: 2014-05-30 | Discharge: 2014-05-30 | Disposition: A | Discharge status: Home / Self Care | Payer: Self-pay | Attending: Emergency Medicine | Admitting: Emergency Medicine

## 2014-05-30 ENCOUNTER — Encounter (HOSPITAL_COMMUNITY): Payer: Self-pay | Admitting: Emergency Medicine

## 2014-05-30 DIAGNOSIS — I10 Essential (primary) hypertension: Secondary | ICD-10-CM | POA: Insufficient documentation

## 2014-05-30 DIAGNOSIS — Z79899 Other long term (current) drug therapy: Secondary | ICD-10-CM | POA: Insufficient documentation

## 2014-05-30 DIAGNOSIS — E7800 Pure hypercholesterolemia, unspecified: Secondary | ICD-10-CM | POA: Insufficient documentation

## 2014-05-30 DIAGNOSIS — H9201 Otalgia, right ear: Secondary | ICD-10-CM

## 2014-05-30 DIAGNOSIS — H9209 Otalgia, unspecified ear: Secondary | ICD-10-CM | POA: Insufficient documentation

## 2014-05-30 DIAGNOSIS — E119 Type 2 diabetes mellitus without complications: Secondary | ICD-10-CM | POA: Insufficient documentation

## 2014-05-30 MED ORDER — ACETAMINOPHEN 500 MG PO TABS
1000.0000 mg | ORAL_TABLET | Freq: Once | ORAL | Status: AC
  Administered 2014-05-30: 1000 mg via ORAL
  Filled 2014-05-30: qty 2

## 2014-05-30 MED ORDER — NEOMYCIN-POLYMYXIN-HC 3.5-10000-1 OT SUSP: 3.0000 [drp] | Freq: Three times a day (TID) | OTIC | Status: DC

## 2014-05-30 MED ORDER — IBUPROFEN 800 MG PO TABS
800.0000 mg | ORAL_TABLET | Freq: Once | ORAL | Status: AC
  Administered 2014-05-30: 800 mg via ORAL
  Filled 2014-05-30: qty 1

## 2014-05-30 NOTE — ED Provider Notes (Signed)
CSN: [id number]     Arrival date & time 05/30/14  0258 History   First MD Initiated Contact with Patient 05/30/14 0300     Chief Complaint  Patient presents with  . Otalgia     (Consider location/radiation/quality/duration/timing/severity/associated sxs/prior Treatment) Patient is a 48 y.o. male presenting with ear pain. The history is provided by the patient. No language interpreter was used.  Otalgia Location:  Right Behind ear:  No abnormality Quality:  Aching Severity:  Moderate Onset quality:  Sudden Timing:  Constant Progression:  Unchanged Chronicity:  Recurrent Context: not direct blow   Relieved by:  Nothing Worsened by:  Nothing tried Ineffective treatments:  None tried Associated symptoms: no abdominal pain, no congestion and no fever   Risk factors: no recent travel     Past Medical History  Diagnosis Date  . Diabetes mellitus   . Hypertension   . High cholesterol    History reviewed. No pertinent past surgical history. Family History  Problem Relation Age of Onset  . Ulcers Mother    History  Substance Use Topics  . Smoking status: Never Smoker   . Smokeless tobacco: Never Used  . Alcohol Use: No     Comment: stopped 3 years ago    Review of Systems  Constitutional: Negative for fever.  HENT: Positive for ear pain. Negative for congestion and facial swelling.   Gastrointestinal: Negative for abdominal pain.  All other systems reviewed and are negative.     Allergies  Review of patient's allergies indicates no known allergies.  Home Medications   Prior to Admission medications   Medication Sig Start Date End Date Taking? Authorizing Provider  glipiZIDE (GLUCOTROL) 10 MG tablet Take 10 mg by mouth 2 (two) times daily before a meal.    Historical Provider, MD  lisinopril (PRINIVIL,ZESTRIL) 10 MG tablet Take 1 tablet (10 mg total) by mouth daily. 08/06/12   Carlyle Dollyhristopher W Lawyer, PA-C  metFORMIN (GLUCOPHAGE) 1000 MG tablet Take 1 tablet (1,000  mg total) by mouth 2 (two) times daily with a meal. 08/06/12   Jamesetta Orleanshristopher W Lawyer, PA-C  pioglitazone (ACTOS) 15 MG tablet Take 1 tablet (15 mg total) by mouth daily. 08/06/12   Jamesetta Orleanshristopher W Lawyer, PA-C  pravastatin (PRAVACHOL) 10 MG tablet Take 10 mg by mouth daily.    Historical Provider, MD   BP 144/94  Pulse 58  Temp(Src) 97.8 F (36.6 C) (Oral)  Resp 14  Ht 6\' 4"  (1.93 m)  SpO2 99% Physical Exam  Constitutional: He is oriented to person, place, and time. He appears well-developed and well-nourished. No distress.  HENT:  Head: Normocephalic and atraumatic.  Right Ear: No mastoid tenderness. Tympanic membrane is not injected, not scarred and not erythematous. No middle ear effusion. No hemotympanum.  Left Ear: No mastoid tenderness. Tympanic membrane is not injected, not scarred and not erythematous.  No middle ear effusion. No hemotympanum.  Mouth/Throat: Oropharynx is clear and moist. No oropharyngeal exudate.  Eyes: Conjunctivae are normal. Pupils are equal, round, and reactive to light.  Neck: Normal range of motion. Neck supple.  Cardiovascular: Normal rate, regular rhythm and intact distal pulses.   Pulmonary/Chest: Effort normal and breath sounds normal. He has no wheezes. He has no rales.  Abdominal: Soft. Bowel sounds are normal. There is no tenderness. There is no rebound and no guarding.  Musculoskeletal: Normal range of motion.  Lymphadenopathy:    He has no cervical adenopathy.  Neurological: He is alert and oriented to person, place, and  time.  Skin: Skin is warm and dry.  Psychiatric: He has a normal mood and affect.    ED Course  Procedures (including critical care time) Labs Review Labs Reviewed - No data to display  Imaging Review No results found.   EKG Interpretation None      MDM   Final diagnoses:  None   Will treat for pain.  Follow up with your PMD for ongoing care   Chase Belluomini K Mirakle Tomlin-Rasch, MD 05/30/14 [phone]

## 2014-05-30 NOTE — Discharge Instructions (Signed)
Ear Drops °You have been diagnosed with a condition requiring you to put drops of medicine into your outer ear. °HOME CARE INSTRUCTIONS  °· Put drops in the affected ear as instructed. After putting the drops in, you will need to lie down with the affected ear facing up for ten minutes so the drops will remain in the ear canal and run down and fill the canal. Continue using the ear drops for as long as directed by your health care provider. °· Prior to getting up, put a cotton ball gently in your ear canal. Leave enough of the cotton ball out so it can be easily removed. Do not attempt to push this down into the canal with a cotton-tipped swab or other instrument. °· Do not irrigate or wash out your ears if you have had a perforated eardrum or mastoid surgery, or unless instructed to do so by your health care provider. °· Keep appointments with your health care provider as instructed. °· Finish all medicine, or use for the length of time prescribed by your health care provider. Continue the drops even if your problem seems to be doing well after a couple days, or continue as instructed. °SEEK MEDICAL CARE IF: °· You become worse or develop increasing pain. °· You notice any unusual drainage from your ear (particularly if the drainage has a bad smell). °· You develop hearing difficulties. °· You experience a serious form of dizziness in which you feel as if the room is spinning, and you feel nauseated (vertigo). °· The outside of your ear becomes red or swollen or both. This may be a sign of an allergic reaction. °MAKE SURE YOU:  °· Understand these instructions. °· Will watch your condition. °· Will get help right away if you are not doing well or get worse. °Document Released: 09/30/2001 Document Revised: 10/11/2013 Document Reviewed: 05/03/2013 °[employer]® Patient Information ©2015 [employer]. This information is not intended to replace advice given to you by your health care provider. Make sure you discuss any  questions you have with your health care provider. ° °

## 2014-05-30 NOTE — ED Notes (Signed)
Pt reports R ear pain that started 4 hours ago. Also reports ringing in ears. Denies fevers/chills

## 2015-07-02 ENCOUNTER — Emergency Department (HOSPITAL_COMMUNITY)
Admission: EM | Admit: 2015-07-02 | Discharge: 2015-07-02 | Disposition: A | Discharge status: Home / Self Care | Payer: Self-pay | Attending: Emergency Medicine | Admitting: Emergency Medicine

## 2015-07-02 ENCOUNTER — Encounter (HOSPITAL_COMMUNITY): Payer: Self-pay | Admitting: *Deleted

## 2015-07-02 DIAGNOSIS — E119 Type 2 diabetes mellitus without complications: Secondary | ICD-10-CM | POA: Insufficient documentation

## 2015-07-02 DIAGNOSIS — Z79899 Other long term (current) drug therapy: Secondary | ICD-10-CM | POA: Insufficient documentation

## 2015-07-02 DIAGNOSIS — R21 Rash and other nonspecific skin eruption: Secondary | ICD-10-CM

## 2015-07-02 DIAGNOSIS — Z7952 Long term (current) use of systemic steroids: Secondary | ICD-10-CM | POA: Insufficient documentation

## 2015-07-02 DIAGNOSIS — B86 Scabies: Secondary | ICD-10-CM | POA: Insufficient documentation

## 2015-07-02 DIAGNOSIS — I10 Essential (primary) hypertension: Secondary | ICD-10-CM | POA: Insufficient documentation

## 2015-07-02 DIAGNOSIS — L309 Dermatitis, unspecified: Secondary | ICD-10-CM

## 2015-07-02 DIAGNOSIS — L259 Unspecified contact dermatitis, unspecified cause: Secondary | ICD-10-CM | POA: Insufficient documentation

## 2015-07-02 DIAGNOSIS — E780 Pure hypercholesterolemia: Secondary | ICD-10-CM | POA: Insufficient documentation

## 2015-07-02 DIAGNOSIS — Z792 Long term (current) use of antibiotics: Secondary | ICD-10-CM | POA: Insufficient documentation

## 2015-07-02 LAB — CBG MONITORING, ED: Glucose-Capillary: 109 mg/dL — ABNORMAL HIGH (ref 65–99)

## 2015-07-02 MED ORDER — HYDROXYZINE HCL 25 MG PO TABS: 25.0000 mg | ORAL_TABLET | Freq: Four times a day (QID) | ORAL | Status: DC

## 2015-07-02 MED ORDER — CEPHALEXIN 250 MG PO CAPS
500.0000 mg | ORAL_CAPSULE | Freq: Once | ORAL | Status: AC
  Administered 2015-07-02: 500 mg via ORAL
  Filled 2015-07-02: qty 2

## 2015-07-02 MED ORDER — PERMETHRIN 5 % EX CREA: TOPICAL_CREAM | CUTANEOUS | Status: DC

## 2015-07-02 MED ORDER — CEPHALEXIN 500 MG PO CAPS: 500.0000 mg | ORAL_CAPSULE | Freq: Four times a day (QID) | ORAL | Status: DC

## 2015-07-02 NOTE — ED Provider Notes (Signed)
CSN: [id number]     Arrival date & time 07/02/15  1716 History  This chart was scribed for non-physician practitioner, Dierdre Forth, PA-C, working with Alvira Monday, MD, by Budd Palmer ED Scribe. This patient was seen in room TR03C/TR03C and the patient's care was started at 6:44 PM     Chief Complaint  Patient presents with  . Rash   The history is provided by the patient and medical records. No language interpreter was used.   HPI Comments: Chase Mccormick is a 49 y.o. male with a PMHx of DM, HTN, and high cholesterol who presents to the Emergency Department complaining of an itchy, full-body rash onset 2 days ago. He reports associated chills. He notes he slept on his bare mattress 3 nights ago just before the rash began. He has not tried any treatments. He states that his son visited him 3 weeks ago and complained of something causing his skin to itch. He states he worked in his yard cutting trees recently while wearing shorts, but this was greater than one week ago. He notes that he slept in his car last night and took all of his clothes and sheets out of his room to clean them. Pt denies fever, chills, abdominal pain, nausea. He would also like to have his blood-sugar levels tested.  He reports non-insulin-dependent diabetes. He does not check his blood sugars at home.  Past Medical History  Diagnosis Date  . Diabetes mellitus   . Hypertension   . High cholesterol    History reviewed. No pertinent past surgical history. Family History  Problem Relation Age of Onset  . Ulcers Mother    Social History  Substance Use Topics  . Smoking status: Never Smoker   . Smokeless tobacco: Never Used  . Alcohol Use: No     Comment: stopped 3 years ago    Review of Systems  Constitutional: Negative for fever and chills.  Gastrointestinal: Negative for nausea and vomiting.  Endocrine: Negative for polydipsia, polyphagia and polyuria.  Skin: Positive for rash.   Allergic/Immunologic: Negative for immunocompromised state.  Hematological: Does not bruise/bleed easily.  Psychiatric/Behavioral: Negative for confusion. The patient is not nervous/anxious.     Allergies  Review of patient's allergies indicates no known allergies.  Home Medications   Prior to Admission medications   Medication Sig Start Date End Date Taking? Authorizing Provider  cephALEXin (KEFLEX) 500 MG capsule Take 1 capsule (500 mg total) by mouth 4 (four) times daily. 07/02/15   Oakley Kossman, PA-C  glipiZIDE (GLUCOTROL) 10 MG tablet Take 10 mg by mouth 2 (two) times daily before a meal.    Historical Provider, MD  hydrOXYzine (ATARAX/VISTARIL) 25 MG tablet Take 1 tablet (25 mg total) by mouth every 6 (six) hours. 07/02/15   Lincoln Kleiner, PA-C  lisinopril (PRINIVIL,ZESTRIL) 10 MG tablet Take 1 tablet (10 mg total) by mouth daily. 08/06/12   Charlestine Night, PA-C  metFORMIN (GLUCOPHAGE) 1000 MG tablet Take 1 tablet (1,000 mg total) by mouth 2 (two) times daily with a meal. 08/06/12   Charlestine Night, PA-C  neomycin-polymyxin-hydrocortisone (CORTISPORIN) 3.5-10000-1 otic suspension Place 3 drops into the right ear 3 (three) times daily. X 7 days 05/30/14   April Palumbo, MD  permethrin (ELIMITE) 5 % cream Apply to affected area once 07/02/15   [place] Jmichael Gille, PA-C  pioglitazone (ACTOS) 15 MG tablet Take 1 tablet (15 mg total) by mouth daily. 08/06/12   Charlestine Night, PA-C  pravastatin (PRAVACHOL) 10 MG tablet Take 10 mg  by mouth daily.    Historical Provider, MD   BP 170/89 mmHg  Pulse 51  Temp(Src) 98.1 F (36.7 C) (Oral)  Resp 18  SpO2 98% Physical Exam  Constitutional: He is oriented to person, place, and time. He appears well-developed and well-nourished. No distress.  HENT:  Head: Normocephalic and atraumatic.  Right Ear: Tympanic membrane, external ear and ear canal normal.  Left Ear: Tympanic membrane, external ear and ear canal normal.  Nose:  Nose normal. No mucosal edema or rhinorrhea.  Mouth/Throat: Uvula is midline. No uvula swelling. No oropharyngeal exudate, posterior oropharyngeal edema, posterior oropharyngeal erythema or tonsillar abscesses.  No swelling of the uvula or oropharynx   Eyes: Conjunctivae are normal.  Neck: Normal range of motion.  Patent airway No stridor; normal phonation Handling secretions without difficulty  Cardiovascular: Normal rate, normal heart sounds and intact distal pulses.   No murmur heard. Pulmonary/Chest: Effort normal and breath sounds normal. No stridor. No respiratory distress. He has no wheezes.  No wheezes or rhonchi  Abdominal: Soft. Bowel sounds are normal. There is no tenderness.  Musculoskeletal: Normal range of motion. He exhibits no edema.  Neurological: He is alert and oriented to person, place, and time.  Skin: Skin is warm and dry. Rash noted. He is not diaphoretic.  Multiple red, raised papules often in a very linear pattern on the arms, legs, anterior and posterior trunk, hands No lesions on the feet Multiple sites with mild induration and severe excoriations throughout  Psychiatric: He has a normal mood and affect.  Nursing note and vitals reviewed.   ED Course  Procedures  DIAGNOSTIC STUDIES: Oxygen Saturation is 98% on RA, normal by my interpretation.    COORDINATION OF CARE: 6:50 PM - Discussed probable contact dermatitis and possible bed bug bites or dermatitis. Discussed plans to order creme, antibiotics, and anti-itch medication. Will consult with colleague. Pt advised of plan for treatment and pt agrees.  Labs Review Labs Reviewed  CBG MONITORING, ED - Abnormal; Notable for the following:    Glucose-Capillary 109 (*)    All other components within normal limits    Imaging Review No results found. I have personally reviewed and evaluated these images and lab results as part of my medical decision-making.   EKG Interpretation None      EMERGENCY  DEPARTMENT US SOFT TISSUE INTERPRETATION "Study: Limited Ultrasound of the noted body part in comments below"  INDICATIONS: Soft tissue infection Multiple views of the body part are obtained with a multi-frequency linear probe  PERFORMED BY:  Myself  IMAGES ARCHIVED?: Yes  SIDE:Left  BODY PART:Upper back  FINDINGS: No abcess noted  LIMITATIONS:  Body Habitus  INTERPRETATION:  No abcess noted and No cellulitis noted  COMMENT:  No evidence of abscess or cellulitis    MDM   Final diagnoses:  Scabies  Rash  Dermatitis    Chase Mccormick presents with rash consistent with scabies.  Multiple sites with severe excoriations and evidence of beginning secondary infection. The largest site was ultrasounded without evidence of abscess.  They have been advised to followup with her primary care doctor 2 weeks after treatment.  They have also been advised to clean entire household including washing sheets and using R.I.D. spray in the car and on sofa.   The use of permethrin cream was discussed as well, they were told to use cream from head to toe & leave on for 8-12 hours.  They've been advised to repeat treatment if new eruptions occur.  BP 170/89 mmHg  Pulse 51  Temp(Src) 98.1 F (36.7 C) (Oral)  Resp 18  SpO2 98%  CBG 109.  I personally performed the services described in this documentation, which was scribed in my presence. The recorded information has been reviewed and is accurate.   Dierdre Forth, PA-C 07/02/15 1954  Chena Chohan, PA-C 07/02/15 1955  Alvira Monday, MD 07/05/15 1220

## 2015-07-02 NOTE — Discharge Instructions (Signed)
1. Medications: Atarax, Keflex, permetherin, usual home medications 2. Treatment: rest, drink plenty of fluids, take medications as prescribed 3. Follow Up: Please followup with your primary doctor in 3 days for discussion of your diagnoses and further evaluation after today's visit; if you do not have a primary care doctor use the resource guide provided to find one; followup with dermatology as needed; Return to the ER for difficulty breathing, return of allergic reaction or other concerning symptoms   Emergency Department Resource Guide 1) Find a Doctor and Pay Out of Pocket Although you won't have to find out who is covered by your insurance plan, it is a good idea to ask around and get recommendations. You will then need to call the office and see if the doctor you have chosen will accept you as a new patient and what types of options they offer for patients who are self-pay. Some doctors offer discounts or will set up payment plans for their patients who do not have insurance, but you will need to ask so you aren't surprised when you get to your appointment.  2) Contact Your Local Health Department Not all health departments have doctors that can see patients for sick visits, but many do, so it is worth a call to see if yours does. If you don't know where your local health department is, you can check in your phone book. The CDC also has a tool to help you locate your state's health department, and many state websites also have listings of all of their local health departments.  3) Find a Walk-in Clinic If your illness is not likely to be very severe or complicated, you may want to try a walk in clinic. These are popping up all over the country in pharmacies, drugstores, and shopping centers. They're usually staffed by nurse practitioners or physician assistants that have been trained to treat common illnesses and complaints. They're usually fairly quick and inexpensive. However, if you have  serious medical issues or chronic medical problems, these are probably not your best option.  No Primary Care Doctor: - Call Health Connect at  [phone] - they can help you locate a primary care doctor that  accepts your insurance, provides certain services, etc. - Physician Referral Service- [phone]  Chronic Pain Problems: Organization         Address  Phone   Notes  [facility]  [phone] Patients need to be referred by their primary care doctor.   Medication Assistance: Organization         Address  Phone   Notes  [facility] Medication [facility] [address] [place] [place], [place] [address] [phone] --Must be a resident of [facility] -- Must have NO insurance coverage whatsoever (no Medicaid/ Medicare, etc.) -- The pt. MUST have a primary care doctor that directs their care regularly and follows them in the community   [facility]  [phone]   [employer]  [phone]    Agencies that provide inexpensive medical care: Organization         Address  Phone   Notes  [facility]  [phone]   [facility]    [phone]   [facility] [address] [place], [place] [address] [phone]   [facility] [address] [place], [place] [phone]   Planned Parenthood    [phone]   [facility]    [phone]   [facility]  [address], [place] Phone:  [phone], Fax:  [phone] Hours of Operation:  9 am - 6 pm, M-F.  Also accepts Medicaid/Medicare and self-pay.  [facility]  [address], [place] Phone: [phone]) [phone], Fax: [phone]) [phone]. Hours of Operation:  8:30 am - 5:30 pm, M-F.  Also accepts Medicaid and self-pay.  [facility] [place] [address], [place] Phone: [phone]) [phone]   [facility] [address],  [place], [place] [phone], Ext. [phone] Mondays & Thursdays: 7-9 AM.  First 15 patients are seen on a first come, first serve basis.    Medicaid-accepting [facility] Providers:  Organization         Address  Phone   Notes  [facility] [address], [place] [phone] Also accepts self-pay patients.  [facility] [address], [place]  [phone]) [phone]   [facility] [address], [place] [phone]) [phone]   [facility] [address] [place] [address], [place] [phone]   Renaye RakersVeita Bland [address], [place]   [phone]) [phone] Only accepts [place] Access [place] patients after they have their name applied to their card.   Self-Pay (no insurance) in [place] [facility] [place]:  Organization         Address  Phone   Notes  Sickle Cell Patients, [facility] [address], [place] [phone]   [facility] [address], [place] [phone]) [phone]   [facility] [place]  [address], [place] [phone]   [facility]/Dr. Osei-Bonsu  [address] [place] [address], [place] or [phone], [place] [phone] Phone number for both [place] and [place] locations is the same.  [facility] [address], [place] [phone]) [phone]   [facility] [place] [address] [place] [address], [place] or [address] [phone]) [phone]   [facility] [address], [place] [phone](336) [phone], phone; [phone]) [phone], fax Sees patients 1st and 3rd Saturday of every month.  Must not qualify for public or private insurance (i.e. Medicaid, Medicare, [place] Health Choice, Veterans' Benefits)  Household income should be no more than 200% of the poverty level The clinic cannot treat you if you are pregnant or think you are pregnant   Sexually transmitted diseases are not treated at the clinic.    Dental Care: Organization         Address  Phone  Notes  [facility] Department of [facility] [address], [place] [phone] Accepts children up to age 48 who are enrolled in [place] or [place] Health Choice; pregnant women with a Medicaid card; and children who have applied for Medicaid or [place] Health Choice, but were declined, whose parents can pay a reduced fee at time of service.  [facility] of [facility] [place]  [address], [place] [phone] Accepts children up to age 48 who are enrolled in [place] or [place] Health Choice; pregnant women with a Medicaid card; and children who have applied for Medicaid or [place] Health Choice, but were declined, whose parents can pay a reduced fee at time of service.  [facility]  [address] [place], [place] [phone] Patients are seen by appointment only. Walk-ins are not accepted. [facility] will see patients 79 years of age and older. Monday - Tuesday (8am-5pm) Most Wednesdays (8:30-5pm) $30 per visit, cash only  [facility] Adult Dental Access PROGRAM  [address], [facility] [phone] Patients are seen by appointment only. Walk-ins are not accepted. [facility] will see patients 42 years of age and older. One Wednesday Evening (Monthly: Volunteer Based).  $30 per visit, cash only  [employer]  [phone] for adults; Children under age 47, call Graduate Pediatric Dentistry at [phone]. Children aged 30-14, please call [phone] to request a pediatric application.  Dental services are provided in all areas of dental care including fillings, crowns and bridges, complete and partial dentures, implants, gum treatment, root canals, and extractions. Preventive care is also provided. Treatment is provided to both adults and children. Patients  are selected via a lottery and there is often a waiting list.   [facility] [address], [place]  [phone] [email or web address]   [facility] [address] [place], [place] [phone], Ext. [phone] Second and Fourth Thursday of each month, opens at 6:30 AM; Clinic ends at 9 AM.  Patients are seen on a first-come first-served basis, and a limited number are seen during each clinic.   [facility]  [address] Ether [address] [place], [place] [phone]   Eligibility Requirements You must have lived in [place], [place], or [place] counties for at least the last three months.   You cannot be eligible for state or federal sponsored [place], including [employer], [place], or [employer].   You generally cannot be eligible for healthcare insurance through your employer.    How to apply: Eligibility screenings are held every Tuesday and Wednesday afternoon from 1:00 pm until 4:00 pm. You do not need an appointment for the interview!  [facility] [address], [place], [place] [phone]   [facility] Health Department  [phone]   [facility] Health Department  [phone]   [facility] Health Department  [phone]    Behavioral Health Resources in the Community: Intensive Outpatient Programs Organization         Address  Phone  Notes  [facility] [address] [place], [place] [phone]   [facility] [address] [place], [place] [phone]   [facility] [address], [place], [place]  [phone]   [facility] [address]  [place], [place] [phone] or [phone]   Substance Abuse Resources Organization         Address  Phone  Notes  [facility]  [phone]   [facility]  [phone]   [facility] [place]  [phone])[phone]   Floydene Flock  [phone]    Residential & Outpatient Substance Abuse Program  [phone]   Psychological Services Organization         Address  Phone  Notes  [facility] Behavioral Health  [phone]   [facility]  [phone]   [facility] [address] [place] [phone] or [phone]    Mobile Crisis Teams Organization         Address  Phone  Notes  [facility], Mobile Crisis Care Unit  [phone]   [facility]  [address] [place],  [place] [phone]   [facility] [address] [place] [phone]    Self-Help/Support Groups Organization         Address  Phone             Notes  [facility] [place] - variety of support groups  336- I7437963 Call for more information  Narcotics Anonymous (NA), [facility] [address], [employer] [place]  2 meetings at this location   Statistician         Address  Phone  Notes  [facility] [address],    [place]  [phone]   [facility]  [address] [place] [address], [place], [place] [phone]   [facility] [address] [place], [place] [phone] Admissions: 8am-3pm M-F  [facility] [address],    [place], [place] [phone]   [facility] [address] [place], [place], [place] [phone]   [facility] [address]  [place], [place] [phone]   [facility] - Intensive Outpatient [address], [place], [place] [phone]   [facility] (Addiction Recovery [facility] [address] [place]  [place], [place] [phone] or [phone]   [facility] ([facility]) [address] [place], [place] [phone] Accepts Medicaid  [facility] [address],  [place] [phone] Substance Abuse/Addiction Treatment   [facility] Organization         Address  Phone  Notes  [facility]  [phone]   Angie Fava, PhD [address] [place], [place]   [phone] or [phone]   [facility]   [address] [place], [place] (520) [phone]   [facility] [address], [place], [place] [phone] Insurance/Medicaid/sponsorship through [facility] Families [address]                                    [place], [place] [phone] Therapy/tele-psych/case  [facility] [address], [place] [phone]    Dr. Lolly Mustache  [phone]   [facility] [place]  [employer] [facility] Dept. 1) [address] [place] 2) [address] [place] 3)  [address], [place] [phone]  [phone]   [facility] Child Abuse Hotline 262-134-8475 or 9047387885 (After Hours)

## 2015-07-02 NOTE — ED Notes (Signed)
Pt reports rash and itching to entire body.

## 2015-07-02 NOTE — ED Notes (Signed)
Pt called no answer 

## 2015-10-09 ENCOUNTER — Emergency Department (HOSPITAL_COMMUNITY)
Admission: EM | Admit: 2015-10-09 | Discharge: 2015-10-09 | Disposition: A | Discharge status: Home / Self Care | Payer: Self-pay | Attending: Emergency Medicine | Admitting: Emergency Medicine

## 2015-10-09 ENCOUNTER — Emergency Department (HOSPITAL_COMMUNITY): Payer: Self-pay

## 2015-10-09 ENCOUNTER — Encounter (HOSPITAL_COMMUNITY): Payer: Self-pay

## 2015-10-09 DIAGNOSIS — R1031 Right lower quadrant pain: Secondary | ICD-10-CM | POA: Insufficient documentation

## 2015-10-09 DIAGNOSIS — R1032 Left lower quadrant pain: Secondary | ICD-10-CM | POA: Insufficient documentation

## 2015-10-09 DIAGNOSIS — I10 Essential (primary) hypertension: Secondary | ICD-10-CM | POA: Insufficient documentation

## 2015-10-09 DIAGNOSIS — E119 Type 2 diabetes mellitus without complications: Secondary | ICD-10-CM | POA: Insufficient documentation

## 2015-10-09 DIAGNOSIS — Z79899 Other long term (current) drug therapy: Secondary | ICD-10-CM | POA: Insufficient documentation

## 2015-10-09 DIAGNOSIS — E7800 Pure hypercholesterolemia, unspecified: Secondary | ICD-10-CM | POA: Insufficient documentation

## 2015-10-09 LAB — URINALYSIS, ROUTINE W REFLEX MICROSCOPIC
BILIRUBIN URINE: NEGATIVE
Glucose, UA: 250 mg/dL — AB
Hgb urine dipstick: NEGATIVE
KETONES UR: NEGATIVE mg/dL
Leukocytes, UA: NEGATIVE
NITRITE: NEGATIVE
Protein, ur: NEGATIVE mg/dL
Specific Gravity, Urine: 1.022 (ref 1.005–1.030)
pH: 6.5 (ref 5.0–8.0)

## 2015-10-09 LAB — BASIC METABOLIC PANEL
Anion gap: 9 (ref 5–15)
BUN: 13 mg/dL (ref 6–20)
CALCIUM: 9.1 mg/dL (ref 8.9–10.3)
CO2: 24 mmol/L (ref 22–32)
CREATININE: 0.73 mg/dL (ref 0.61–1.24)
Chloride: 101 mmol/L (ref 101–111)
GFR calc non Af Amer: >60 mL/min (ref >60)
Glucose, Bld: 210 mg/dL — ABNORMAL HIGH (ref 65–99)
Potassium: 3.9 mmol/L (ref 3.5–5.1)
SODIUM: 134 mmol/L — AB (ref 135–145)

## 2015-10-09 LAB — CBC WITH DIFFERENTIAL/PLATELET
Basophils Absolute: 0 10*3/uL (ref 0.0–0.1)
Basophils Relative: 0 %
Eosinophils Absolute: 0.1 10*3/uL (ref 0.0–0.7)
Eosinophils Relative: 2 %
HCT: 39.7 % (ref 39.0–52.0)
Hemoglobin: 13 g/dL (ref 13.0–17.0)
Lymphocytes Relative: 67 %
Lymphs Abs: 3.3 10*3/uL (ref 0.7–4.0)
MCH: 27.8 pg (ref 26.0–34.0)
MCHC: 32.7 g/dL (ref 30.0–36.0)
MCV: 84.8 fL (ref 78.0–100.0)
Monocytes Absolute: 0.2 10*3/uL (ref 0.1–1.0)
Monocytes Relative: 4 %
Neutro Abs: 1.3 10*3/uL — ABNORMAL LOW (ref 1.7–7.7)
Neutrophils Relative %: 27 %
Platelets: 217 10*3/uL (ref 150–400)
RBC: 4.68 MIL/uL (ref 4.22–5.81)
RDW: 12.6 % (ref 11.5–15.5)
WBC: 5 10*3/uL (ref 4.0–10.5)

## 2015-10-09 MED ORDER — SODIUM CHLORIDE 0.9 % IV SOLN
INTRAVENOUS | Status: DC
  Administered 2015-10-09: 11:00:00 via INTRAVENOUS

## 2015-10-09 MED ORDER — IOHEXOL 300 MG/ML  SOLN
100.0000 mL | Freq: Once | INTRAMUSCULAR | Status: AC | PRN
  Administered 2015-10-09: 80 mL via INTRAVENOUS

## 2015-10-09 MED ORDER — TRAMADOL HCL 50 MG PO TABS: 50.0000 mg | ORAL_TABLET | Freq: Four times a day (QID) | ORAL | Status: DC | PRN

## 2015-10-09 MED ORDER — SODIUM CHLORIDE 0.9 % IV BOLUS (SEPSIS)
500.0000 mL | Freq: Once | INTRAVENOUS | Status: AC
  Administered 2015-10-09: 500 mL via INTRAVENOUS

## 2015-10-09 NOTE — Discharge Instructions (Signed)
Workup for the groin pain without any specific abnormalities found. Take pain medicine as directed. Make appointment to follow-up with your regular doctor. Return for any new or worse symptoms.

## 2015-10-09 NOTE — ED Notes (Signed)
Patient transported to CT 

## 2015-10-09 NOTE — ED Provider Notes (Signed)
CSN: [id number]     Arrival date & time 10/09/15  4098 History   First MD Initiated Contact with Patient 10/09/15 [phone]     Chief Complaint  Patient presents with  . Leg Pain  . Groin Pain     (Consider location/radiation/quality/duration/timing/severity/associated sxs/prior Treatment) The history is provided by the patient.   49 year old male with complaint of bilateral groin pain. Started initially on the right side and now was moved to the left side. Symptoms of them present the altogether for 10 days. Patient denies any scrotal swelling or pain or any penile discharge or concerns for STD. Not associated with nausea or vomiting. No specific history of any particular injury did step in a hole at work but he did have discomfort there prior to that.  Past Medical History  Diagnosis Date  . Diabetes mellitus   . Hypertension   . High cholesterol    History reviewed. No pertinent past surgical history. Family History  Problem Relation Age of Onset  . Ulcers Mother    Social History  Substance Use Topics  . Smoking status: Never Smoker   . Smokeless tobacco: Never Used  . Alcohol Use: No     Comment: stopped 3 years ago    Review of Systems  Constitutional: Negative for fever.  HENT: Negative for congestion.   Eyes: Negative for visual disturbance.  Respiratory: Negative for shortness of breath.   Cardiovascular: Negative for chest pain.  Gastrointestinal: Negative for nausea, vomiting and abdominal pain.  Genitourinary: Negative for dysuria, scrotal swelling and testicular pain.  Musculoskeletal: Negative for back pain.  Skin: Negative for rash.  Neurological: Negative for headaches.  Hematological: Does not bruise/bleed easily.  Psychiatric/Behavioral: Negative for confusion.      Allergies  Review of patient's allergies indicates no known allergies.  Home Medications   Prior to Admission medications   Medication Sig Start Date End Date Taking? Authorizing  Provider  Aspirin-Salicylamide-Caffeine (BC HEADACHE PO) Take 1 packet by mouth as needed (headache/pain).   Yes Historical Provider, MD  glipiZIDE (GLUCOTROL) 10 MG tablet Take 10 mg by mouth 2 (two) times daily before a meal.   Yes Historical Provider, MD  hydrOXYzine (ATARAX/VISTARIL) 25 MG tablet Take 1 tablet (25 mg total) by mouth every 6 (six) hours. 07/02/15  Yes Hannah Muthersbaugh, PA-C  lisinopril (PRINIVIL,ZESTRIL) 10 MG tablet Take 1 tablet (10 mg total) by mouth daily. 08/06/12  Yes Christopher Lawyer, PA-C  metFORMIN (GLUCOPHAGE) 1000 MG tablet Take 1 tablet (1,000 mg total) by mouth 2 (two) times daily with a meal. 08/06/12  Yes Christopher Lawyer, PA-C  pioglitazone (ACTOS) 15 MG tablet Take 1 tablet (15 mg total) by mouth daily. 08/06/12  Yes Christopher Lawyer, PA-C  pravastatin (PRAVACHOL) 10 MG tablet Take 10 mg by mouth daily.   Yes Historical Provider, MD  traMADol (ULTRAM) 50 MG tablet Take 1 tablet (50 mg total) by mouth every 6 (six) hours as needed. 10/09/15   Vanetta Mulders, MD   BP 135/88 mmHg  Pulse 53  Temp(Src) 97.6 F (36.4 C) (Oral)  Resp 16  Ht  (1.981 m)  Wt 127.007 kg  BMI 32.36 kg/m2  SpO2 100% Physical Exam  Constitutional: He is oriented to person, place, and time. He appears well-developed and well-nourished. No distress.  HENT:  Head: Normocephalic and atraumatic.  Mouth/Throat: Oropharynx is clear and moist.  Eyes: Conjunctivae and EOM are normal. Pupils are equal, round, and reactive to light.  Neck: Normal range of motion.  Neck supple.  Cardiovascular: Normal rate, regular rhythm and normal heart sounds.   No murmur heard. Pulmonary/Chest: Effort normal and breath sounds normal. No respiratory distress.  Abdominal: Soft. Bowel sounds are normal. There is no tenderness.  Genitourinary: Penis normal.  Circumcised male no discharge no lesions testicles nontender no masses no evidence of a groin hernia no adenopathy.  Musculoskeletal:  Normal range of motion. He exhibits no edema.  Neurological: He is alert and oriented to person, place, and time. No cranial nerve deficit. He exhibits normal muscle tone. Coordination normal.  Skin: Skin is warm. No rash noted. No erythema.  Nursing note and vitals reviewed.   ED Course  Procedures (including critical care time) Labs Review Labs Reviewed  CBC WITH DIFFERENTIAL/PLATELET - Abnormal; Notable for the following:    Neutro Abs 1.3 (*)    All other components within normal limits  BASIC METABOLIC PANEL - Abnormal; Notable for the following:    Sodium 134 (*)    Glucose, Bld 210 (*)    All other components within normal limits  URINALYSIS, ROUTINE W REFLEX MICROSCOPIC (NOT AT [facility]) - Abnormal; Notable for the following:    Glucose, UA 250 (*)    All other components within normal limits   Results for orders placed or performed during the hospital encounter of 10/09/15  CBC with Differential/Platelet  Result Value Ref Range   WBC 5.0 4.0 - 10.5 K/uL   RBC 4.68 4.22 - 5.81 MIL/uL   Hemoglobin 13.0 13.0 - 17.0 g/dL   HCT 47.839.7 29.539.0 - 62.152.0 %   MCV 84.8 78.0 - 100.0 fL   MCH 27.8 26.0 - 34.0 pg   MCHC 32.7 30.0 - 36.0 g/dL   RDW 30.812.6 65.711.5 - 84.615.5 %   Platelets 217 150 - 400 K/uL   Neutrophils Relative % 27 %   Neutro Abs 1.3 (L) 1.7 - 7.7 K/uL   Lymphocytes Relative 67 %   Lymphs Abs 3.3 0.7 - 4.0 K/uL   Monocytes Relative 4 %   Monocytes Absolute 0.2 0.1 - 1.0 K/uL   Eosinophils Relative 2 %   Eosinophils Absolute 0.1 0.0 - 0.7 K/uL   Basophils Relative 0 %   Basophils Absolute 0.0 0.0 - 0.1 K/uL  Basic metabolic panel  Result Value Ref Range   Sodium 134 (L) 135 - 145 mmol/L   Potassium 3.9 3.5 - 5.1 mmol/L   Chloride 101 101 - 111 mmol/L   CO2 24 22 - 32 mmol/L   Glucose, Bld 210 (H) 65 - 99 mg/dL   BUN 13 6 - 20 mg/dL   Creatinine, Ser 9.620.73 0.61 - 1.24 mg/dL   Calcium 9.1 8.9 - 95.210.3 mg/dL   GFR calc non Af Amer >60 >60 mL/min   GFR calc Af Amer >60 >60  mL/min   Anion gap 9 5 - 15  Urinalysis, Routine w reflex microscopic (not at [facility])  Result Value Ref Range   Color, Urine YELLOW YELLOW   APPearance CLEAR CLEAR   Specific Gravity, Urine 1.022 1.005 - 1.030   pH 6.5 5.0 - 8.0   Glucose, UA 250 (A) NEGATIVE mg/dL   Hgb urine dipstick NEGATIVE NEGATIVE   Bilirubin Urine NEGATIVE NEGATIVE   Ketones, ur NEGATIVE NEGATIVE mg/dL   Protein, ur NEGATIVE NEGATIVE mg/dL   Nitrite NEGATIVE NEGATIVE   Leukocytes, UA NEGATIVE NEGATIVE     Imaging Review Ct Abdomen Pelvis W Contrast  10/09/2015  CLINICAL DATA:  Right groin pain began 2  weeks ago. Now with bilateral pain. EXAM: CT ABDOMEN AND PELVIS WITH CONTRAST TECHNIQUE: Multidetector CT imaging of the abdomen and pelvis was performed using the standard protocol following bolus administration of intravenous contrast. CONTRAST:  80mL OMNIPAQUE IOHEXOL 300 MG/ML  SOLN COMPARISON:  CT 08/05/2010 FINDINGS: Lower chest: Small subpleural densities in the lung bases bilaterally are unchanged from the prior study and consistent with scarring. No acute infiltrate or effusion. Heart size is upper normal. Hepatobiliary: Fatty infiltration of liver. Liver size and contour normal. Negative for mass. Gallbladder and bile ducts normal. Pancreas: Negative Spleen: Negative Adrenals/Urinary Tract: Negative Stomach/Bowel: Moderate constipation with stool throughout the colon. Negative for bowel obstruction. Normal appendix. No bowel edema. Vascular/Lymphatic: Normal aorta and iliac arteries. Negative for adenopathy. Reproductive: Moderate prostate enlargement. Prominent seminal vesicles. Other: No free fluid. Negative for inguinal hernia. Negative for mass lesion. Musculoskeletal: Negative IMPRESSION: Fatty infiltration of the liver Constipation without bowel obstruction. Normal appendix Negative for inguinal hernia or mass. Electronically Signed   By: Marlan Palau M.D.   On: 10/09/2015 11:54   Dg Hips Bilat With Pelvis  3-4 Views  10/09/2015  CLINICAL DATA:  Groin pain.  No known injury . EXAM: DG HIP (WITH OR WITHOUT PELVIS) 3-4V BILAT COMPARISON:  None. FINDINGS: No acute bony or joint abnormality identified. No evidence of fracture dislocation. IMPRESSION: No acute or focal abnormality. Electronically Signed   By: Maisie Fus  Register   On: 10/09/2015 08:28   I have personally reviewed and evaluated these images and lab results as part of my medical decision-making.   EKG Interpretation None      MDM   Final diagnoses:  Groin pain, left  Groin pain, right   Workup for the bilateral groin pain without significant findings. Lab work was normal urinalysis and normal physical exam was normal. In addition CT scan abdomen and pelvis without any abnormalities typically no evidence of an inguinal hernia. Will treat the as if a mild muscle strain with tramadol have patient follow-up with her primary care provider. A sheet blood sugars a little elevated today but no evidence of any of metabolic acidosis. Patient without an STD concern and no evidence of an STD on exam.    Vanetta Mulders, MD 10/09/15 1257

## 2015-10-09 NOTE — ED Notes (Addendum)
Pt c/o left upper leg and groin pain x 1 week after mis-stepping into a hole while at work. Pt states worse w/ movement. Denies any urinary or GI symptoms associated w/ pain. Pt ambulatory w/ steady gait to room.

## 2015-10-09 NOTE — ED Notes (Signed)
Pt returned from CT °

## 2016-06-12 ENCOUNTER — Encounter (HOSPITAL_COMMUNITY): Payer: Self-pay | Admitting: Emergency Medicine

## 2016-06-12 ENCOUNTER — Ambulatory Visit (HOSPITAL_COMMUNITY)
Admission: EM | Admit: 2016-06-12 | Discharge: 2016-06-12 | Disposition: A | Discharge status: Home / Self Care | Payer: Self-pay | Attending: Emergency Medicine | Admitting: Emergency Medicine

## 2016-06-12 DIAGNOSIS — R1011 Right upper quadrant pain: Secondary | ICD-10-CM

## 2016-06-12 DIAGNOSIS — M778 Other enthesopathies, not elsewhere classified: Secondary | ICD-10-CM

## 2016-06-12 DIAGNOSIS — R109 Unspecified abdominal pain: Secondary | ICD-10-CM

## 2016-06-12 DIAGNOSIS — M6588 Other synovitis and tenosynovitis, other site: Secondary | ICD-10-CM

## 2016-06-12 DIAGNOSIS — M779 Enthesopathy, unspecified: Principal | ICD-10-CM

## 2016-06-12 LAB — POCT URINALYSIS DIP (DEVICE)
Bilirubin Urine: NEGATIVE
GLUCOSE, UA: 500 mg/dL — AB
Hgb urine dipstick: NEGATIVE
KETONES UR: NEGATIVE mg/dL
Leukocytes, UA: NEGATIVE
NITRITE: NEGATIVE
PH: 7 (ref 5.0–8.0)
PROTEIN: NEGATIVE mg/dL
Specific Gravity, Urine: 1.015 (ref 1.005–1.030)
UROBILINOGEN UA: 0.2 mg/dL (ref 0.0–1.0)

## 2016-06-12 MED ORDER — NAPROXEN 375 MG PO TABS: 375.0000 mg | ORAL_TABLET | Freq: Two times a day (BID) | ORAL | 0 refills | Status: DC

## 2016-06-12 NOTE — ED Triage Notes (Signed)
Pt c/o constant back pain onset x3 days that increases w/activity.   Also c/o bilateral hand pain when he makes a fist  Denies inj/trauma... Steady gait... A&O x4... 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

## 2016-06-12 NOTE — ED Provider Notes (Signed)
CSN: [id number]     Arrival date & time 06/12/16  1354 History   First MD Initiated Contact with Patient 06/12/16 1432     Chief Complaint  Patient presents with  . 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 This started 3 days ago. He states that when he was sneezing today this made the pain worse. The pain only occurs when he bends over. No pain with standing or at rest. He denies urinary symptoms or gross hematuria. Denies trauma, injury or any known repetitive work. He advances an hypothesis that it may be his mattress or prolonged  Second complaint is that of soreness in the digits when making a fist. This began approximately one week ago. I can, denies injury or trauma.   After the patient had submitted a urine for urinalysis and while waiting for a result I spoke to the patient about his results. At that time he states that the pain had moved from the right flank to the right lateral abdomen just inferior to the costal margin. With deep palpation there is mild to moderate tenderness. No rebound or guarding. The abdomen was examined and found to have no other areas of tenderness. No rebound or guarding. Negative Murphy sign. No tenderness to the costal margin or ribs.      Past Medical History:  Diagnosis Date  . Diabetes mellitus   . High cholesterol   . Hypertension    History reviewed. No pertinent surgical history. Family History  Problem Relation Age of Onset  . Ulcers Mother    Social History  Substance Use Topics  . Smoking status: Never Smoker  . Smokeless tobacco: Never Used  . Alcohol use No     Comment: stopped 3 years ago    Review of Systems  Constitutional: Negative.   HENT: Negative.   Respiratory: Negative.   Gastrointestinal: Negative.   Genitourinary: Negative.   Musculoskeletal:  Positive for arthralgias and back pain. Negative for gait problem, joint swelling, myalgias, neck pain and neck stiffness.       As per HPI  Skin: Negative.   Neurological: Negative for dizziness, weakness, numbness and headaches.  Psychiatric/Behavioral: Negative.   All other systems reviewed and are negative.   Allergies  Review of patient's allergies indicates no known allergies.  Home Medications   Prior to Admission medications   Medication Sig Start Date End Date Taking? Authorizing Provider  lisinopril (PRINIVIL,ZESTRIL) 10 MG tablet Take 1 tablet (10 mg total) by mouth daily. 08/06/12  Yes Christopher Lawyer, PA-C  metFORMIN (GLUCOPHAGE) 1000 MG tablet Take 1 tablet (1,000 mg total) by mouth 2 (two) times daily with a meal. 08/06/12  Yes Christopher Lawyer, PA-C  pioglitazone (ACTOS) 15 MG tablet Take 1 tablet (15 mg total) by mouth daily. 08/06/12  Yes Christopher Lawyer, PA-C  Aspirin-Salicylamide-Caffeine (BC HEADACHE PO) Take 1 packet by mouth as needed (headache/pain).    Historical Provider, MD  glipiZIDE (GLUCOTROL) 10 MG tablet Take 10 mg by mouth 2 (two) times daily before a meal.    Historical Provider, MD  hydrOXYzine (ATARAX/VISTARIL) 25 MG tablet Take 1 tablet (25 mg total) by mouth every 6 (six) hours. 07/02/15   Hannah Muthersbaugh, PA-C  naproxen (NAPROSYN) 375 MG tablet Take 1 tablet (375 mg total) by mouth 2 (two) times daily. 06/12/16   Hayden Rasmussenavid Kamarion Zagami, NP  pravastatin (PRAVACHOL) 10 MG tablet Take 10  mg by mouth daily.    Historical Provider, MD  traMADol (ULTRAM) 50 MG tablet Take 1 tablet (50 mg total) by mouth every 6 (six) hours as needed. 10/09/15   Vanetta Mulders, MD   Meds Ordered and Administered this Visit  Medications - No data to display  BP 146/86 (BP Location: Left Arm)   Pulse 60   Temp 97.6 F (36.4 C) (Oral)   Resp 12   SpO2 100%  No data found.   Physical Exam  Constitutional: He is oriented to person, place, and time. He appears  well-developed and well-nourished. No distress.  Eyes: EOM are normal.  Neck: Normal range of motion. Neck supple.  Cardiovascular: Normal rate.   Pulmonary/Chest: Effort normal.  Musculoskeletal: He exhibits no edema or deformity.  Patient points to the right flank as a source of pain. Deep palpation does not reproduce the pain. There is no tenderness there. The patient reproduces the pain while flexing at the spine to 90.  Examination of the hands reveal tenderness to the medial and lateral aspect of the DIPs of the long and ring fingers and lesser to the PIPs. Some tenderness to the flexor surface of the digits. No tenderness to the hand. There is no swelling or discoloration. Brisk capillary refill. Normal warmth and color.  Neurological: He is alert and oriented to person, place, and time. No cranial nerve deficit.  Skin: Skin is warm and dry.  Psychiatric: He has a normal mood and affect.  Nursing note and vitals reviewed.   Urgent Care Course   Clinical Course    Procedures (including critical care time)  Labs Review Labs Reviewed  POCT URINALYSIS DIP (DEVICE) - Abnormal; Notable for the following:       Result Value   Glucose, UA 500 (*)    All other components within normal limits   Results for orders placed or performed during the hospital encounter of 06/12/16  POCT urinalysis dip (device)  Result Value Ref Range   Glucose, UA 500 (A) NEGATIVE mg/dL   Bilirubin Urine NEGATIVE NEGATIVE   Ketones, ur NEGATIVE NEGATIVE mg/dL   Specific Gravity, Urine 1.015 1.005 - 1.030   Hgb urine dipstick NEGATIVE NEGATIVE   pH 7.0 5.0 - 8.0   Protein, ur NEGATIVE NEGATIVE mg/dL   Urobilinogen, UA 0.2 0.0 - 1.0 mg/dL   Nitrite NEGATIVE NEGATIVE   Leukocytes, UA NEGATIVE NEGATIVE     Imaging Review No results found.   Visual Acuity Review  Right Eye Distance:   Left Eye Distance:   Bilateral Distance:    Right Eye Near:   Left Eye Near:    Bilateral Near:          MDM   1. Tendinitis of hand   2. Acute right flank pain   3. Abdominal pain, RUQ (right upper quadrant)    Abdominal Pain, Adult It is likely that the pain in your right flank that has migrated to the right abdomen is due to gas in the colon. Differential would also include possible kidney stone however there is no blood in the urine and palpation to the right lateral abdomen at the anterior and mid axillary line with suggest more of a colon gas pain. Urinalysis does not show blood or other abnormalities except for blood sugar. Should you develop increased pain in the abdomen, vomiting, fever or worsening with other symptoms go to the emergency department promptly. Otherwise follow-up with your primary care doctor.  The pain in your  fingers and hand a likely due to tendinitis. 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 Apply cold compresses to see if that helps. If not any may try heat. Take the Naprosyn as directed for pain. Meds ordered this encounter  Medications  . naproxen (NAPROSYN) 375 MG tablet    Sig: Take 1 tablet (375 mg total) by mouth 2 (two) times daily.    Dispense:  20 tablet    Refill:  0    Order Specific Question:   Supervising Provider    Answer:   Domenick Gong [[id number]]       Hayden Rasmussen, NP 06/12/16 1520    Hayden Rasmussen, NP 06/12/16 1523

## 2016-06-16 ENCOUNTER — Encounter (HOSPITAL_COMMUNITY): Payer: Self-pay | Admitting: Emergency Medicine

## 2016-06-16 ENCOUNTER — Emergency Department (HOSPITAL_COMMUNITY)
Admission: EM | Admit: 2016-06-16 | Discharge: 2016-06-16 | Disposition: A | Discharge status: Home / Self Care | Payer: Self-pay | Attending: Emergency Medicine | Admitting: Emergency Medicine

## 2016-06-16 DIAGNOSIS — Z7982 Long term (current) use of aspirin: Secondary | ICD-10-CM | POA: Insufficient documentation

## 2016-06-16 DIAGNOSIS — Y939 Activity, unspecified: Secondary | ICD-10-CM | POA: Insufficient documentation

## 2016-06-16 DIAGNOSIS — Y929 Unspecified place or not applicable: Secondary | ICD-10-CM | POA: Insufficient documentation

## 2016-06-16 DIAGNOSIS — I10 Essential (primary) hypertension: Secondary | ICD-10-CM | POA: Insufficient documentation

## 2016-06-16 DIAGNOSIS — S39011A Strain of muscle, fascia and tendon of abdomen, initial encounter: Secondary | ICD-10-CM | POA: Insufficient documentation

## 2016-06-16 DIAGNOSIS — Z79899 Other long term (current) drug therapy: Secondary | ICD-10-CM | POA: Insufficient documentation

## 2016-06-16 DIAGNOSIS — Z7984 Long term (current) use of oral hypoglycemic drugs: Secondary | ICD-10-CM | POA: Insufficient documentation

## 2016-06-16 DIAGNOSIS — R739 Hyperglycemia, unspecified: Secondary | ICD-10-CM

## 2016-06-16 DIAGNOSIS — E1165 Type 2 diabetes mellitus with hyperglycemia: Secondary | ICD-10-CM | POA: Insufficient documentation

## 2016-06-16 DIAGNOSIS — Y999 Unspecified external cause status: Secondary | ICD-10-CM | POA: Insufficient documentation

## 2016-06-16 DIAGNOSIS — X509XXA Other and unspecified overexertion or strenuous movements or postures, initial encounter: Secondary | ICD-10-CM | POA: Insufficient documentation

## 2016-06-16 LAB — URINALYSIS, ROUTINE W REFLEX MICROSCOPIC
BILIRUBIN URINE: NEGATIVE
GLUCOSE, UA: 250 mg/dL — AB
HGB URINE DIPSTICK: NEGATIVE
Ketones, ur: NEGATIVE mg/dL
Leukocytes, UA: NEGATIVE
Nitrite: NEGATIVE
PROTEIN: NEGATIVE mg/dL
SPECIFIC GRAVITY, URINE: 1.021 (ref 1.005–1.030)
pH: 6 (ref 5.0–8.0)

## 2016-06-16 LAB — I-STAT CHEM 8, ED
BUN: 21 mg/dL — ABNORMAL HIGH (ref 6–20)
Calcium, Ion: 1.11 mmol/L — ABNORMAL LOW (ref 1.13–1.30)
Chloride: 100 mmol/L — ABNORMAL LOW (ref 101–111)
Creatinine, Ser: 0.9 mg/dL (ref 0.61–1.24)
Glucose, Bld: 229 mg/dL — ABNORMAL HIGH (ref 65–99)
HCT: 41 % (ref 39.0–52.0)
Hemoglobin: 13.9 g/dL (ref 13.0–17.0)
Potassium: 3.8 mmol/L (ref 3.5–5.1)
Sodium: 138 mmol/L (ref 135–145)
TCO2: 24 mmol/L (ref 0–100)

## 2016-06-16 LAB — CBG MONITORING, ED: GLUCOSE-CAPILLARY: 241 mg/dL — AB (ref 65–99)

## 2016-06-16 NOTE — ED Provider Notes (Signed)
[facility]-EMERGENCY DEPT Provider Note   CSN: [id number] Arrival date & time: 06/16/16  0447     History   Chief Complaint Chief Complaint  Patient presents with  . Flank Pain    HPI Chase Mccormick is a 50 y.o. male.  The history is provided by the patient. No language interpreter was used.  Flank Pain     Chase Mccormick is a 50 y.o. male who presents to the Emergency Department complaining of flank pain.  He reports 5 days of right-sided flank pain. The pain is worse with laughing and coughing. No known injuries. He works as a Scientist, physiological. He denies any fevers, chest pain, shortness of breath, nausea, vomiting, diarrhea, constipation, dysuria. He is diabetic and takes metformin.  Past Medical History:  Diagnosis Date  . Diabetes mellitus   . High cholesterol   . Hypertension     There are no active problems to display for this patient.   History reviewed. No pertinent surgical history.     Home Medications    Prior to Admission medications   Medication Sig Start Date End Date Taking? Authorizing Provider  Aspirin-Salicylamide-Caffeine (BC HEADACHE PO) Take 1 packet by mouth as needed (headache/pain).    Historical Provider, MD  glipiZIDE (GLUCOTROL) 10 MG tablet Take 10 mg by mouth 2 (two) times daily before a meal.    Historical Provider, MD  hydrOXYzine (ATARAX/VISTARIL) 25 MG tablet Take 1 tablet (25 mg total) by mouth every 6 (six) hours. 07/02/15   Hannah Muthersbaugh, PA-C  lisinopril (PRINIVIL,ZESTRIL) 10 MG tablet Take 1 tablet (10 mg total) by mouth daily. 08/06/12   Charlestine Night, PA-C  metFORMIN (GLUCOPHAGE) 1000 MG tablet Take 1 tablet (1,000 mg total) by mouth 2 (two) times daily with a meal. 08/06/12   Charlestine Night, PA-C  naproxen (NAPROSYN) 375 MG tablet Take 1 tablet (375 mg total) by mouth 2 (two) times daily. 06/12/16   Hayden Rasmussen, NP  pioglitazone (ACTOS) 15 MG tablet Take 1 tablet (15 mg total) by mouth daily. 08/06/12    Christopher Lawyer, PA-C  pravastatin (PRAVACHOL) 10 MG tablet Take 10 mg by mouth daily.    Historical Provider, MD  traMADol (ULTRAM) 50 MG tablet Take 1 tablet (50 mg total) by mouth every 6 (six) hours as needed. 10/09/15   Vanetta Mulders, MD    Family History Family History  Problem Relation Age of Onset  . Ulcers Mother     Social History Social History  Substance Use Topics  . Smoking status: Never Smoker  . Smokeless tobacco: Never Used  . Alcohol use No     Comment: stopped 3 years ago     Allergies   Review of patient's allergies indicates no known allergies.   Review of Systems Review of Systems  Genitourinary: Positive for flank pain.  All other systems reviewed and are negative.    Physical Exam Updated Vital Signs BP 148/91 (BP Location: Right Arm)   Pulse 67   Temp 98 F (36.7 C) (Oral)   Resp 18   Ht 6\' 4"  (1.93 m)   Wt 254 lb (115.2 kg)   SpO2 98%   BMI 30.92 kg/m   Physical Exam  Constitutional: He is oriented to person, place, and time. He appears well-developed and well-nourished.  HENT:  Head: Normocephalic and atraumatic.  Cardiovascular: Normal rate and regular rhythm.   No murmur heard. Pulmonary/Chest: Effort normal and breath sounds normal. No respiratory distress.  Abdominal: Soft. There is no tenderness. There  is no rebound and no guarding.  Musculoskeletal: He exhibits no edema or tenderness.  No back tenderness to palpation  Neurological: He is alert and oriented to person, place, and time.  Skin: Skin is warm and dry.  Psychiatric: He has a normal mood and affect. His behavior is normal.  Nursing note and vitals reviewed.    ED Treatments / Results  Labs (all labs ordered are listed, but only abnormal results are displayed) Labs Reviewed  CBG MONITORING, ED - Abnormal; Notable for the following:       Result Value   Glucose-Capillary 241 (*)    All other components within normal limits  URINALYSIS, ROUTINE W REFLEX  MICROSCOPIC (NOT AT [facility])  I-STAT CHEM 8, ED    EKG  EKG Interpretation None       Radiology No results found.  Procedures Procedures (including critical care time)  Medications Ordered in ED Medications - No data to display   Initial Impression / Assessment and Plan / ED Course  I have reviewed the triage vital signs and the nursing notes.  Pertinent labs & imaging results that were available during my care of the patient were reviewed by me and considered in my medical decision making (see chart for details).  Clinical Course    Patient here for evaluation of right flank pain. There is no pain to palpation on examination but it is reproducible when he twists to the right. Presentation is consistent with musculoskeletal side pain. No evidence of renal colic, acute infection. Presentation is not consistent with dissection. Patient is concerned about his kidneys, will check UA i-STAT Chem-8 to evaluate electrolytes.  UA not c/w UTI, CR nl, plan to d/c home with outpatient follow up.   Final Clinical Impressions(s) / ED Diagnoses   Final diagnoses:  Abdominal muscle strain, initial encounter  Hyperglycemia    New Prescriptions New Prescriptions   No medications on file     Tilden FossaElizabeth Adahlia Stembridge, MD 06/16/16 [phone]

## 2016-06-16 NOTE — ED Triage Notes (Signed)
Pt reports R sided flank pain, denies GU S/S. Pt also requesting his sugar to be checked. Says he is a diabetic, takes metformin but does not have a glucometer at home, uses neighbors when he wants to check his sugar

## 2016-06-16 NOTE — ED Notes (Signed)
Phlebotomy at bedside at this time.

## 2016-10-08 ENCOUNTER — Emergency Department (HOSPITAL_COMMUNITY): Payer: No Typology Code available for payment source

## 2016-10-08 ENCOUNTER — Encounter (HOSPITAL_COMMUNITY): Payer: Self-pay | Admitting: Neurology

## 2016-10-08 ENCOUNTER — Emergency Department (HOSPITAL_COMMUNITY)
Admission: EM | Admit: 2016-10-08 | Discharge: 2016-10-08 | Disposition: A | Discharge status: Home / Self Care | Payer: No Typology Code available for payment source | Attending: Emergency Medicine | Admitting: Emergency Medicine

## 2016-10-08 DIAGNOSIS — Z79899 Other long term (current) drug therapy: Secondary | ICD-10-CM | POA: Insufficient documentation

## 2016-10-08 DIAGNOSIS — Z7984 Long term (current) use of oral hypoglycemic drugs: Secondary | ICD-10-CM | POA: Insufficient documentation

## 2016-10-08 DIAGNOSIS — Y92410 Unspecified street and highway as the place of occurrence of the external cause: Secondary | ICD-10-CM | POA: Diagnosis not present

## 2016-10-08 DIAGNOSIS — Y939 Activity, unspecified: Secondary | ICD-10-CM | POA: Insufficient documentation

## 2016-10-08 DIAGNOSIS — E119 Type 2 diabetes mellitus without complications: Secondary | ICD-10-CM | POA: Insufficient documentation

## 2016-10-08 DIAGNOSIS — I10 Essential (primary) hypertension: Secondary | ICD-10-CM | POA: Diagnosis not present

## 2016-10-08 DIAGNOSIS — M79632 Pain in left forearm: Secondary | ICD-10-CM | POA: Diagnosis not present

## 2016-10-08 DIAGNOSIS — Y999 Unspecified external cause status: Secondary | ICD-10-CM | POA: Diagnosis not present

## 2016-10-08 DIAGNOSIS — S39012A Strain of muscle, fascia and tendon of lower back, initial encounter: Secondary | ICD-10-CM | POA: Diagnosis not present

## 2016-10-08 DIAGNOSIS — S3992XA Unspecified injury of lower back, initial encounter: Secondary | ICD-10-CM | POA: Diagnosis present

## 2016-10-08 MED ORDER — IBUPROFEN 600 MG PO TABS: 600.0000 mg | ORAL_TABLET | Freq: Four times a day (QID) | ORAL | 0 refills | Status: DC | PRN
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
## 2016-10-08 NOTE — Discharge Instructions (Signed)
Ibuprofen as needed for pain. Ice or heat to affected area for additional pain relief.  Follow up with your primary care provider if symptoms are not improved in 1 week.  Return to ER for new or worsening symptoms, any additional concerns.
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
## 2017-01-19 ENCOUNTER — Encounter (HOSPITAL_COMMUNITY): Payer: Self-pay

## 2017-01-19 ENCOUNTER — Emergency Department (HOSPITAL_COMMUNITY)
Admission: EM | Admit: 2017-01-19 | Discharge: 2017-01-19 | Disposition: A | Discharge status: Home / Self Care | Payer: Self-pay | Attending: Emergency Medicine | Admitting: Emergency Medicine

## 2017-01-19 DIAGNOSIS — Z760 Encounter for issue of repeat prescription: Secondary | ICD-10-CM | POA: Insufficient documentation

## 2017-01-19 DIAGNOSIS — E1165 Type 2 diabetes mellitus with hyperglycemia: Secondary | ICD-10-CM | POA: Insufficient documentation

## 2017-01-19 DIAGNOSIS — Z7984 Long term (current) use of oral hypoglycemic drugs: Secondary | ICD-10-CM | POA: Insufficient documentation

## 2017-01-19 DIAGNOSIS — I10 Essential (primary) hypertension: Secondary | ICD-10-CM | POA: Insufficient documentation

## 2017-01-19 DIAGNOSIS — R739 Hyperglycemia, unspecified: Secondary | ICD-10-CM

## 2017-01-19 LAB — CBG MONITORING, ED: GLUCOSE-CAPILLARY: 218 mg/dL — AB (ref 65–99)

## 2017-01-19 MED ORDER — METFORMIN HCL 1000 MG PO TABS: 1000.0000 mg | ORAL_TABLET | Freq: Two times a day (BID) | ORAL | 0 refills | Status: DC

## 2017-01-19 MED ORDER — IBUPROFEN 400 MG PO TABS
600.0000 mg | ORAL_TABLET | Freq: Once | ORAL | Status: AC
  Administered 2017-01-19: 600 mg via ORAL
  Filled 2017-01-19: qty 1

## 2017-01-19 MED ORDER — PIOGLITAZONE HCL 15 MG PO TABS: 15.0000 mg | ORAL_TABLET | Freq: Every day | ORAL | 0 refills | Status: DC

## 2017-01-19 MED ORDER — LISINOPRIL 10 MG PO TABS: 10.0000 mg | ORAL_TABLET | Freq: Every day | ORAL | 0 refills | Status: DC

## 2017-01-19 MED ORDER — PRAVASTATIN SODIUM 10 MG PO TABS: 10.0000 mg | ORAL_TABLET | Freq: Every day | ORAL | 0 refills | Status: DC

## 2017-01-19 MED ORDER — GLIPIZIDE 10 MG PO TABS: 10.0000 mg | ORAL_TABLET | Freq: Two times a day (BID) | ORAL | 0 refills | Status: DC

## 2017-01-19 NOTE — ED Provider Notes (Signed)
[facility] Provider Note   CSN: [id number] Arrival date & time: 01/19/17  4098     History   Chief Complaint Chief Complaint  Patient presents with  . Headache  . Medication Refill    HPI Chase Mccormick is a 51 y.o. male.  HPI Patient presents emergency department requesting refill of all of his medications including his blood pressure and diabetes medications.  He reports mild headache without focal weakness over the past 2 days.  He's tried over-the-counter medications without improvement in his symptoms.  He currently is without a primary care physician is been mainly using the emergency department for refills of his medications.  He denies urinary frequency.  No other complaints at this time.  Symptoms are mild in severity.   Past Medical History:  Diagnosis Date  . Diabetes mellitus   . High cholesterol   . Hypertension     There are no active problems to display for this patient.   History reviewed. No pertinent surgical history.     Home Medications    Prior to Admission medications   Medication Sig Start Date End Date Taking? Authorizing Provider  glipiZIDE (GLUCOTROL) 10 MG tablet Take 1 tablet (10 mg total) by mouth 2 (two) times daily before a meal. 01/19/17   Azalia Bilis, MD  lisinopril (PRINIVIL,ZESTRIL) 10 MG tablet Take 1 tablet (10 mg total) by mouth daily. 01/19/17   Azalia Bilis, MD  metFORMIN (GLUCOPHAGE) 1000 MG tablet Take 1 tablet (1,000 mg total) by mouth 2 (two) times daily with a meal. 01/19/17   Azalia Bilis, MD  pioglitazone (ACTOS) 15 MG tablet Take 1 tablet (15 mg total) by mouth daily. 01/19/17   Azalia Bilis, MD  pravastatin (PRAVACHOL) 10 MG tablet Take 1 tablet (10 mg total) by mouth daily. 01/19/17   Azalia Bilis, MD    Family History Family History  Problem Relation Age of Onset  . Ulcers Mother     Social History Social History  Substance Use Topics  . Smoking status: Never Smoker  . Smokeless tobacco: Never Used  .  Alcohol use No     Comment: stopped 3 years ago     Allergies   Patient has no known allergies.   Review of Systems Review of Systems  All other systems reviewed and are negative.    Physical Exam Updated Vital Signs BP 127/73   Pulse 64   Temp 98.3 F (36.8 C) (Oral)   Resp 16   Ht  (1.93 m)   Wt 263 lb (119.3 kg)   SpO2 97%   BMI 32.01 kg/m   Physical Exam  Constitutional: He is oriented to person, place, and time. He appears well-developed and well-nourished.  HENT:  Head: Normocephalic and atraumatic.  Eyes: EOM are normal. Pupils are equal, round, and reactive to light.  Neck: Normal range of motion.  Cardiovascular: Normal rate, regular rhythm and normal heart sounds.   Pulmonary/Chest: Effort normal and breath sounds normal. No respiratory distress.  Abdominal: Soft. He exhibits no distension. There is no tenderness.  Musculoskeletal: Normal range of motion.  Neurological: He is alert and oriented to person, place, and time.  5/5 strength in major muscle groups of  bilateral upper and lower extremities. Speech normal. No facial asymetry.   Skin: Skin is warm and dry.  Psychiatric: He has a normal mood and affect. Judgment normal.  Nursing note and vitals reviewed.    ED Treatments / Results  Labs (all labs ordered are  listed, but only abnormal results are displayed) Labs Reviewed  CBG MONITORING, ED - Abnormal; Notable for the following:       Result Value   Glucose-Capillary 218 (*)    All other components within normal limits    EKG  EKG Interpretation None       Radiology No results found.  Procedures Procedures (including critical care time)  Medications Ordered in ED Medications  ibuprofen (ADVIL,MOTRIN) tablet 600 mg (600 mg Oral Given 01/19/17 0843)     Initial Impression / Assessment and Plan / ED Course  I have reviewed the triage vital signs and the nursing notes.  Pertinent labs & imaging results that were available  during my care of the patient were reviewed by me and considered in my medical decision making (see chart for details).     Patient is overall well-appearing.  Case management involved to help the patient find a primary care physician.  Patient written a 10 day prescription for all of his medications.  He understands importance of having a primary care physician and following up closely to have his chronic illnesses managed.  No life-threatening emergency.  Final Clinical Impressions(s) / ED Diagnoses   Final diagnoses:  Hyperglycemia    New Prescriptions Current Discharge Medication List       Azalia Bilis, MD 01/19/17 [phone]

## 2017-01-19 NOTE — ED Triage Notes (Signed)
Pt states he ran out of DM medication and started having HA on yesterday; pt states use of OTC; pt c/o pain at 7/10 on arrival. Pt a&ox 4 on arrival.

## 2017-01-19 NOTE — ED Notes (Addendum)
Patient states he has been out of his medications for approximately one week.

## 2017-01-20 ENCOUNTER — Telehealth: Payer: Self-pay | Admitting: Surgery

## 2017-01-20 NOTE — Telephone Encounter (Signed)
ED CM received consult regarding assisting patient with finding a PCP.  CM attempted to contact patient by phone unable to LVM. Will make second attempt tomorrow.

## 2017-02-17 ENCOUNTER — Ambulatory Visit (HOSPITAL_COMMUNITY)
Admission: EM | Admit: 2017-02-17 | Discharge: 2017-02-17 | Disposition: A | Discharge status: Home / Self Care | Payer: Self-pay | Attending: Family Medicine | Admitting: Family Medicine

## 2017-02-17 ENCOUNTER — Encounter (HOSPITAL_COMMUNITY): Payer: Self-pay | Admitting: *Deleted

## 2017-02-17 DIAGNOSIS — E119 Type 2 diabetes mellitus without complications: Secondary | ICD-10-CM

## 2017-02-17 DIAGNOSIS — Z760 Encounter for issue of repeat prescription: Secondary | ICD-10-CM

## 2017-02-17 MED ORDER — PIOGLITAZONE HCL 15 MG PO TABS: 15.0000 mg | ORAL_TABLET | Freq: Every day | ORAL | 1 refills | Status: DC

## 2017-02-17 MED ORDER — LISINOPRIL 10 MG PO TABS: 10.0000 mg | ORAL_TABLET | Freq: Every day | ORAL | 1 refills | Status: DC

## 2017-02-17 MED ORDER — GLIPIZIDE 10 MG PO TABS: 10.0000 mg | ORAL_TABLET | Freq: Two times a day (BID) | ORAL | 1 refills | Status: DC

## 2017-02-17 MED ORDER — METFORMIN HCL 1000 MG PO TABS: 1000.0000 mg | ORAL_TABLET | Freq: Two times a day (BID) | ORAL | 1 refills | Status: DC

## 2017-02-17 NOTE — ED Triage Notes (Signed)
Pt  States   He    Needs  Refills   Refills   Of  His  Medication  He  States       He  Has  No   pcp

## 2017-02-17 NOTE — ED Provider Notes (Signed)
CSN: [id number]     Arrival date & time 02/17/17  1959 History   First MD Initiated Contact with Patient 02/17/17 2022     Chief Complaint  Patient presents with  . Medication Refill   (Consider location/radiation/quality/duration/timing/severity/associated sxs/prior Treatment) Patient states he needs refills on diabetic medications   The history is provided by the patient.  Medication Refill  Medications/supplies requested:  Metformin pioglitazone lisinopril glipizide Reason for request:  Clinic/provider not available Medications taken before: yes - see home medications   Patient has complete original prescription information: yes     Past Medical History:  Diagnosis Date  . Diabetes mellitus   . High cholesterol   . Hypertension    History reviewed. No pertinent surgical history. Family History  Problem Relation Age of Onset  . Ulcers Mother    Social History  Substance Use Topics  . Smoking status: Never Smoker  . Smokeless tobacco: Never Used  . Alcohol use No     Comment: stopped 3 years ago    Review of Systems  Constitutional: Negative.   HENT: Negative.   Eyes: Negative.   Respiratory: Negative.   Cardiovascular: Negative.   Gastrointestinal: Negative.   Endocrine: Negative.   Genitourinary: Negative.   Musculoskeletal: Negative.   Allergic/Immunologic: Negative.   Neurological: Negative.   Hematological: Negative.   Psychiatric/Behavioral: Negative.     Allergies  Patient has no known allergies.  Home Medications   Prior to Admission medications   Medication Sig Start Date End Date Taking? Authorizing Provider  glipiZIDE (GLUCOTROL) 10 MG tablet Take 1 tablet (10 mg total) by mouth 2 (two) times daily before a meal. 02/17/17   Deatra Canter, FNP  lisinopril (PRINIVIL,ZESTRIL) 10 MG tablet Take 1 tablet (10 mg total) by mouth daily. 02/17/17   Deatra Canter, FNP  metFORMIN (GLUCOPHAGE) 1000 MG tablet Take 1 tablet (1,000 mg total) by mouth 2  (two) times daily with a meal. 02/17/17   Deatra Canter, FNP  pioglitazone (ACTOS) 15 MG tablet Take 1 tablet (15 mg total) by mouth daily. 02/17/17   Deatra Canter, FNP  pravastatin (PRAVACHOL) 10 MG tablet Take 1 tablet (10 mg total) by mouth daily. 01/19/17   Azalia Bilis, MD   Meds Ordered and Administered this Visit  Medications - No data to display  BP (!) 142/83 (BP Location: Right Arm)   Pulse 78   Temp 98.6 F (37 C) (Oral)   Resp 18   SpO2 100%  No data found.   Physical Exam  Constitutional: He is oriented to person, place, and time. He appears well-developed and well-nourished.  HENT:  Head: Normocephalic and atraumatic.  Right Ear: External ear normal.  Left Ear: External ear normal.  Mouth/Throat: Oropharynx is clear and moist.  Eyes: Conjunctivae and EOM are normal. Pupils are equal, round, and reactive to light.  Neck: Normal range of motion. Neck supple.  Cardiovascular: Normal rate, regular rhythm and normal heart sounds.   Pulmonary/Chest: Effort normal. He has wheezes.  Abdominal: Soft. Bowel sounds are normal.  Musculoskeletal: Normal range of motion.  Neurological: He is alert and oriented to person, place, and time.  Nursing note and vitals reviewed.   Urgent Care Course     Procedures (including critical care time)  Labs Review Labs Reviewed - No data to display  Imaging Review No results found.   Visual Acuity Review  Right Eye Distance:   Left Eye Distance:   Bilateral Distance:  Right Eye Near:   Left Eye Near:    Bilateral Near:         MDM   1. Medication refill    Pioglitazone Metformin Lisinopril Glipizide  Follow up with pcp      Deatra Canter, FNP 02/17/17 2041

## 2017-05-26 ENCOUNTER — Encounter (HOSPITAL_COMMUNITY): Payer: Self-pay | Admitting: *Deleted

## 2017-05-26 ENCOUNTER — Ambulatory Visit (HOSPITAL_COMMUNITY)
Admission: EM | Admit: 2017-05-26 | Discharge: 2017-05-26 | Disposition: A | Discharge status: Home / Self Care | Payer: Self-pay | Attending: Family Medicine | Admitting: Family Medicine

## 2017-05-26 DIAGNOSIS — E118 Type 2 diabetes mellitus with unspecified complications: Secondary | ICD-10-CM

## 2017-05-26 DIAGNOSIS — I10 Essential (primary) hypertension: Secondary | ICD-10-CM

## 2017-05-26 LAB — GLUCOSE, CAPILLARY: GLUCOSE-CAPILLARY: 250 mg/dL — AB (ref 65–99)

## 2017-05-26 MED ORDER — LISINOPRIL 10 MG PO TABS: 10.0000 mg | ORAL_TABLET | Freq: Every day | ORAL | 2 refills | Status: DC

## 2017-05-26 MED ORDER — GLIPIZIDE 10 MG PO TABS: 10.0000 mg | ORAL_TABLET | Freq: Two times a day (BID) | ORAL | 2 refills | Status: DC

## 2017-05-26 MED ORDER — PRAVASTATIN SODIUM 10 MG PO TABS: 10.0000 mg | ORAL_TABLET | Freq: Every day | ORAL | 1 refills | Status: DC

## 2017-05-26 MED ORDER — METFORMIN HCL 1000 MG PO TABS: 1000.0000 mg | ORAL_TABLET | Freq: Two times a day (BID) | ORAL | 2 refills | Status: DC

## 2017-05-26 NOTE — ED Triage Notes (Signed)
Pt reports     He  Is  Out  Of   Lisinopril      And  His    Diabetic  meds       Has  Not  Took   Any  In  10  Days

## 2017-05-27 NOTE — ED Provider Notes (Signed)
  [facility]   [id number] 05/26/17 Arrival Time: 1515  ASSESSMENT & PLAN:  1. Type 2 diabetes mellitus with complication, without long-term current use of insulin (HCC)   2. Essential hypertension     Meds ordered this encounter  Medications  . glipiZIDE (GLUCOTROL) 10 MG tablet    Sig: Take 1 tablet (10 mg total) by mouth 2 (two) times daily before a meal.    Dispense:  30 tablet    Refill:  2  . lisinopril (PRINIVIL,ZESTRIL) 10 MG tablet    Sig: Take 1 tablet (10 mg total) by mouth daily.    Dispense:  30 tablet    Refill:  2  . metFORMIN (GLUCOPHAGE) 1000 MG tablet    Sig: Take 1 tablet (1,000 mg total) by mouth 2 (two) times daily with a meal.    Dispense:  30 tablet    Refill:  2  . pravastatin (PRAVACHOL) 10 MG tablet    Sig: Take 1 tablet (10 mg total) by mouth daily.    Dispense:  30 tablet    Refill:  1   Medications refilled. Stressed importance of finding a PCP. [facility] He plans to establish care asap. May f/u here if needed.  Reviewed expectations re: course of current medical issues. Questions answered. Outlined signs and symptoms indicating need for more acute intervention. Patient verbalized understanding. After Visit Summary given.   SUBJECTIVE:  Chase Mccormick is a 51 y.o. male who requests medication refills. Has been out of meds for 1-2 weeks. No specific concerns. Unsure if either his HTN or DM has ever been well-controlled. Appetite normal. No significant weight changes. "Feel ok".  ROS: As per HPI.   OBJECTIVE:  Vitals:   05/26/17 1540  BP: (!) 150/108  Pulse: 82  Resp: 18  SpO2: 100%     General appearance: alert; no distress Lungs: clear to auscultation bilaterally Heart: regular rate and rhythm Abdomen: soft, non-tender Extremities: no cyanosis or edema; symmetrical with no gross deformities Skin: warm and dry Neurologic: normal symmetric reflexes; normal gait Psychological:   alert and cooperative; normal mood and affect   Labs Reviewed  GLUCOSE, CAPILLARY - Abnormal; Notable for the following:       Result Value   Glucose-Capillary 250 (*)    All other components within normal limits     No Known Allergies  PMHx, SurgHx, SocialHx, Medications, and Allergies were reviewed in the Visit Navigator and updated as appropriate.      Mardella LaymanHagler, Alanny Rivers, MD 05/27/17 [phone]

## 2017-11-03 ENCOUNTER — Emergency Department (HOSPITAL_COMMUNITY)
Admission: EM | Admit: 2017-11-03 | Discharge: 2017-11-03 | Disposition: A | Discharge status: Home / Self Care | Payer: Self-pay | Attending: Emergency Medicine | Admitting: Emergency Medicine

## 2017-11-03 ENCOUNTER — Other Ambulatory Visit: Payer: Self-pay

## 2017-11-03 ENCOUNTER — Encounter (HOSPITAL_COMMUNITY): Payer: Self-pay

## 2017-11-03 DIAGNOSIS — Z7984 Long term (current) use of oral hypoglycemic drugs: Secondary | ICD-10-CM | POA: Insufficient documentation

## 2017-11-03 DIAGNOSIS — Z760 Encounter for issue of repeat prescription: Secondary | ICD-10-CM | POA: Insufficient documentation

## 2017-11-03 DIAGNOSIS — M79601 Pain in right arm: Secondary | ICD-10-CM | POA: Insufficient documentation

## 2017-11-03 DIAGNOSIS — E119 Type 2 diabetes mellitus without complications: Secondary | ICD-10-CM | POA: Insufficient documentation

## 2017-11-03 DIAGNOSIS — I10 Essential (primary) hypertension: Secondary | ICD-10-CM | POA: Insufficient documentation

## 2017-11-03 DIAGNOSIS — Z79899 Other long term (current) drug therapy: Secondary | ICD-10-CM | POA: Insufficient documentation

## 2017-11-03 LAB — CBG MONITORING, ED: GLUCOSE-CAPILLARY: 242 mg/dL — AB (ref 65–99)

## 2017-11-03 MED ORDER — PIOGLITAZONE HCL 15 MG PO TABS: 15.0000 mg | ORAL_TABLET | Freq: Every day | ORAL | 1 refills | Status: DC

## 2017-11-03 MED ORDER — PRAVASTATIN SODIUM 10 MG PO TABS: 10.0000 mg | ORAL_TABLET | Freq: Every day | ORAL | 1 refills | Status: DC

## 2017-11-03 MED ORDER — METFORMIN HCL 1000 MG PO TABS: 1000.0000 mg | ORAL_TABLET | Freq: Two times a day (BID) | ORAL | 1 refills | Status: DC

## 2017-11-03 MED ORDER — GLIPIZIDE 10 MG PO TABS: 10.0000 mg | ORAL_TABLET | Freq: Two times a day (BID) | ORAL | 1 refills | Status: DC

## 2017-11-03 MED ORDER — LISINOPRIL 10 MG PO TABS: 10.0000 mg | ORAL_TABLET | Freq: Every day | ORAL | 1 refills | Status: DC

## 2017-11-03 NOTE — Discharge Instructions (Signed)
Please read instructions below. Apply ice to your arm for 20 minutes at a time. You can take tylenol every 4-6 hours as needed for pain. It is important to establish primary care for management of your daily medications. Return to the ER for new or concerning symptoms.

## 2017-11-03 NOTE — Care Management Note (Signed)
Case Management Note  Patient Details  Name: Ezequiel Kayserbdourahimou Dowda MRN: [id number] Date of Birth: 03/15/1966  Subjective/Objective:                  right arm pain/medication refill  Action/Plan: CM spoke with the patient at the bedside. Patient states he needs a PCP (would like to apply for an orange card). [facility] He agrees to call tomorrow to schedule an appointment. He also understands the walk-in hours. Patient states he gets his prescriptions filled at [facility] and can afford them with a prescription discount card he has. Denies any additional questions or needs at this time.   Expected Discharge Date:   11/03/17            Expected Discharge Plan:  Home/Self Care  In-House Referral:     Discharge planning Services  CM Consult, Indigent Health Clinic  Post Acute Care Choice:    Choice offered to:     DME Arranged:    DME Agency:     HH Arranged:    HH Agency:     Status of Service:  Completed, signed off  If discussed at [employer] Meetings, dates discussed:    Additional Comments:  Antony HasteBennett, Axavier Pressley Harris, RN 11/03/2017, 6:11 PM

## 2017-11-03 NOTE — ED Provider Notes (Signed)
[facility]   CSN: [id number] Arrival date & time: 11/03/17  1508     History   Chief Complaint Chief Complaint  Patient presents with  . Arm Pain  . Medication Refill    HPI Chase Mccormick is a 52 y.o. male past medical history of type 2 diabetes, hyperlipidemia, hypertension, presenting to the ED for medication refill of all of his medications. States he has been out of his medication for 5 days.  Patient takes Glipizide, lisinopril, metformin, pioglitazone, and pravastatin.  Patient states he currently does not have a PCP and is requesting referral and orange card. Denies CP, SOB, HA, vision changes. Patient with second complaint of intermittent right arm pain times 1 week.  No associated injury.  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  Denies neck pain, swelling, or any other complaints.  Has not been treating his pain at home.  The history is provided by the patient.    Past Medical History:  Diagnosis Date  . Diabetes mellitus   . High cholesterol   . Hypertension     There are no active problems to display for this patient.   History reviewed. No pertinent surgical history.     Home Medications    Prior to Admission medications   Medication Sig Start Date End Date Taking? Authorizing Provider  glipiZIDE (GLUCOTROL) 10 MG tablet Take 1 tablet (10 mg total) by mouth 2 (two) times daily before a meal. 11/03/17 12/03/17  Fern Asmar, [place] N, PA-C  lisinopril (PRINIVIL,ZESTRIL) 10 MG tablet Take 1 tablet (10 mg total) by mouth daily. 11/03/17   Ezana Hubbert, [place] N, PA-C  metFORMIN (GLUCOPHAGE) 1000 MG tablet Take 1 tablet (1,000 mg total) by mouth 2 (two) times daily with a meal. 11/03/17   Guinevere Stephenson, [place] N, PA-C  pioglitazone (ACTOS) 15 MG tablet Take 1 tablet (15 mg total) by mouth daily. 11/03/17   Kischa Altice, [place] N, PA-C    pravastatin (PRAVACHOL) 10 MG tablet Take 1 tablet (10 mg total) by mouth daily. 11/03/17   Seretha Estabrooks, [place] N, PA-C    Family History Family History  Problem Relation Age of Onset  . Ulcers Mother     Social History Social History   Tobacco Use  . Smoking status: Never Smoker  . Smokeless tobacco: Never Used  Substance Use Topics  . Alcohol use: No    Comment: stopped 3 years ago  . Drug use: No     Allergies   Patient has no known allergies.   Review of Systems Review of Systems  Constitutional: Negative for fever.  Musculoskeletal: Positive for myalgias.  All other systems reviewed and are negative.    Physical Exam Updated Vital Signs BP 134/89 (BP Location: Right Arm)   Pulse (!) 59   Temp 98.1 F (36.7 C) (Oral)   Resp 16   SpO2 99%   Physical Exam  Constitutional: He appears well-developed and well-nourished. No distress.  HENT:  Head: Normocephalic and atraumatic.  Eyes: Conjunctivae are normal.  Cardiovascular: Regular rhythm, normal heart sounds and intact distal pulses.  Pulmonary/Chest: Effort normal and breath sounds normal.  Musculoskeletal:  Mild tenderness over right mid to distal humerus.  No C-spine, shoulder, elbow, or wrist tenderness.  Joints with normal range of motion.  No edema or ecchymosis.  5/5 strength bilateral upper extremities. Normal sensation.  Psychiatric: He has a  normal mood and affect. His behavior is normal.  Nursing note and vitals reviewed.    ED Treatments / Results  Labs (all labs ordered are listed, but only abnormal results are displayed) Labs Reviewed  CBG MONITORING, ED - Abnormal; Notable for the following components:      Result Value   Glucose-Capillary 242 (*)    All other components within normal limits    EKG  EKG Interpretation None       Radiology No results found.  Procedures Procedures (including critical care time)  Medications Ordered in ED Medications - No data to  display   Initial Impression / Assessment and Plan / ED Course  I have reviewed the triage vital signs and the nursing notes.  Pertinent labs & imaging results that were available during my care of the patient were reviewed by me and considered in my medical decision making (see chart for details).     Patient presenting for medication refill, and 1 week of right arm pain without injury.  Suspect muscular skeletal etiology of pain.  Normal range of motion, neurovascularly intact.  Recommend ice, Tylenol, rest. Case management consulted, recommending [facility] referral.  Will provide PCP follow-up, and stressed importance of establishing care for management of his daily medications.  Discussed results, findings, treatment and follow up. Patient advised of return precautions. Patient verbalized understanding and agreed with plan.  Final Clinical Impressions(s) / ED Diagnoses   Final diagnoses:  Medication refill  Right arm pain    ED Discharge Orders        Ordered    glipiZIDE (GLUCOTROL) 10 MG tablet  2 times daily before meals     11/03/17 1832    lisinopril (PRINIVIL,ZESTRIL) 10 MG tablet  Daily     11/03/17 1832    metFORMIN (GLUCOPHAGE) 1000 MG tablet  2 times daily with meals     11/03/17 1832    pioglitazone (ACTOS) 15 MG tablet  Daily     11/03/17 1832    pravastatin (PRAVACHOL) 10 MG tablet  Daily     11/03/17 1832       Eura Radabaugh, [place] N, PA-C 11/03/17 1851    Lisandra Mathisen, [place] N, PA-C 11/03/17 1852    Nira Conn, MD 11/04/17 [phone]

## 2017-11-03 NOTE — ED Triage Notes (Signed)
PT reports right hand pain that radiates up arm x 1 week. Denies injury.   Pt also reports being out of diabetic medications x 4 days,

## 2017-11-03 NOTE — ED Notes (Signed)
ED Provider at bedside. 

## 2017-11-03 NOTE — ED Notes (Signed)
Case management speaking with patient

## 2017-11-03 NOTE — ED Notes (Signed)
Pt reports he is out of his medications and does not have a PCP but "wants to speak with someone about getting an orange card". EDP explained to patient that he does need a PCP so that he can be monitored on his medications. Also spoke with pt about [facility].

## 2017-11-26 ENCOUNTER — Ambulatory Visit (HOSPITAL_COMMUNITY): Payer: Self-pay

## 2017-11-26 ENCOUNTER — Ambulatory Visit (INDEPENDENT_AMBULATORY_CARE_PROVIDER_SITE_OTHER): Payer: Self-pay

## 2017-11-26 ENCOUNTER — Ambulatory Visit (HOSPITAL_COMMUNITY)
Admission: EM | Admit: 2017-11-26 | Discharge: 2017-11-26 | Disposition: A | Discharge status: Home / Self Care | Payer: Self-pay | Attending: Family Medicine | Admitting: Family Medicine

## 2017-11-26 ENCOUNTER — Other Ambulatory Visit: Payer: Self-pay

## 2017-11-26 ENCOUNTER — Encounter (HOSPITAL_COMMUNITY): Payer: Self-pay | Admitting: Emergency Medicine

## 2017-11-26 DIAGNOSIS — M79601 Pain in right arm: Secondary | ICD-10-CM

## 2017-11-26 NOTE — ED Triage Notes (Signed)
Right shoulder pain for 3 weeks and reports unable to lift arm beyond a certain degree.  Patient did lift right arm above his head at that point in conversation.  Unknown injury.  Patient has been sen in ed.

## 2017-11-26 NOTE — ED Provider Notes (Signed)
[facility]    CSN: [id number] Arrival date & time: 11/26/17  1042     History   Chief Complaint Chief Complaint  Patient presents with  . Arm Pain    HPI Chase Mccormick is a 52 y.o. male history of hypertension, type 2 diabetes presenting today with right shoulder pain.  Pain is been going on for approximately 3 weeks.  He states that at night he feels a sharp pain in his shoulder.  He denies any specific injury but feels it may be related to a fall he had approximately 3 weeks ago when he slipped and fell with his arms out in front of him.  He was seen in the ED 2 weeks ago for the same pain and was advised rest, Tylenol, ice.  No imaging was obtained at that visit.  Patient states he has difficulty moving his arm above shoulder level.  Denies any numbness or tingling.  Denies any neck pain or elbow pain.  Denies any difficulty reaching up to brush hair.   HPI  Past Medical History:  Diagnosis Date  . Diabetes mellitus   . High cholesterol   . Hypertension     There are no active problems to display for this patient.   History reviewed. No pertinent surgical history.     Home Medications    Prior to Admission medications   Medication Sig Start Date End Date Taking? Authorizing Provider  glipiZIDE (GLUCOTROL) 10 MG tablet Take 1 tablet (10 mg total) by mouth 2 (two) times daily before a meal. 11/03/17 12/03/17  Robinson, [place] N, PA-C  lisinopril (PRINIVIL,ZESTRIL) 10 MG tablet Take 1 tablet (10 mg total) by mouth daily. 11/03/17   Robinson, [place] N, PA-C  metFORMIN (GLUCOPHAGE) 1000 MG tablet Take 1 tablet (1,000 mg total) by mouth 2 (two) times daily with a meal. 11/03/17   Robinson, [place] N, PA-C  pioglitazone (ACTOS) 15 MG tablet Take 1 tablet (15 mg total) by mouth daily. 11/03/17   Robinson, [place] N, PA-C  pravastatin (PRAVACHOL) 10 MG tablet Take 1 tablet (10 mg total) by mouth daily. 11/03/17   Robinson, [place] N, PA-C    Family History Family  History  Problem Relation Age of Onset  . Ulcers Mother     Social History Social History   Tobacco Use  . Smoking status: Never Smoker  . Smokeless tobacco: Never Used  Substance Use Topics  . Alcohol use: No    Comment: stopped 3 years ago  . Drug use: No     Allergies   Patient has no known allergies.   Review of Systems Review of Systems  Constitutional: Negative for fatigue and fever.  Respiratory: Negative for shortness of breath.   Cardiovascular: Negative for chest pain.  Gastrointestinal: Negative for abdominal pain, nausea and vomiting.  Musculoskeletal: Positive for arthralgias and myalgias. Negative for joint swelling and neck pain.  Skin: Negative for color change, pallor and wound.  Neurological: Negative for dizziness, light-headedness and headaches.     Physical Exam Triage Vital Signs ED Triage Vitals  Enc Vitals Group     BP 11/26/17 1131 130/80     Pulse Rate 11/26/17 1131 62     Resp 11/26/17 1131 20     Temp 11/26/17 1131 98.1 F (36.7 C)     Temp Source 11/26/17 1131 Oral     SpO2 11/26/17 1131 98 %     Weight --      Height --  Head Circumference --      Peak Flow --      Pain Score 11/26/17 1129 8     Pain Loc --      Pain Edu? --      Excl. in GC? --    No data found.  Updated Vital Signs BP 130/80 (BP Location: Left Arm) Comment (BP Location): large cuff  Pulse 62   Temp 98.1 F (36.7 C) (Oral)   Resp 20   SpO2 98%   Visual Acuity Right Eye Distance:   Left Eye Distance:   Bilateral Distance:    Right Eye Near:   Left Eye Near:    Bilateral Near:     Physical Exam  Constitutional: He appears well-developed and well-nourished. No distress.  HENT:  Head: Normocephalic and atraumatic.  Eyes: Conjunctivae are normal.  Neck: Neck supple.  Cardiovascular: Normal rate and regular rhythm.  No murmur heard. Pulmonary/Chest: Effort normal and breath sounds normal. No respiratory distress.  Clear to auscultation  bilaterally  Abdominal: Soft. There is no tenderness.  Musculoskeletal: He exhibits no edema.  Right shoulder: No obvious deformity or swelling.  Nontender to palpation along cervical spine or neck musculature nontender to palpation of scapular spine, clavicle and AC joint.  Mild tenderness to proximal and humerus.  Full active range of motion with forward flexion, decreased range of motion with backward extension and internal rotation.  Full passive range of motion with abduction.  Strength 5 out of 5 in all directions compared to left.  Neurological: He is alert.  Skin: Skin is warm and dry.  Psychiatric: He has a normal mood and affect.  Nursing note and vitals reviewed.    UC Treatments / Results  Labs (all labs ordered are listed, but only abnormal results are displayed) Labs Reviewed - No data to display  EKG  EKG Interpretation None       Radiology Dg Humerus Right  Result Date: 11/26/2017 CLINICAL DATA:  Right arm pain after fall 3 weeks ago. EXAM: RIGHT HUMERUS - 2+ VIEW COMPARISON:  None. FINDINGS: There is no evidence of fracture or other focal bone lesions. Soft tissues are unremarkable. IMPRESSION: Normal right humerus. Electronically Signed   By: Lupita RaiderJames  Green Jr, M.D.   On: 11/26/2017 12:47    Procedures Procedures (including critical care time)  Medications Ordered in UC Medications - No data to display   Initial Impression / Assessment and Plan / UC Course  I have reviewed the triage vital signs and the nursing notes.  Pertinent labs & imaging results that were available during my care of the patient were reviewed by me and considered in my medical decision making (see chart for details).     Xray negative for fracture or bony disease. Pain seems more muscular vs. Bicep tendonitis. Patient able to have full ROM in all directions. Moving arm freely, does not appear to be in pain. Frequently putting hands behind head. Does no appear to be adhesive capsulitis.  Advised continue tylenol and ibuprofen, ice. Provided shoulder exercises to improve strength. Follow up with ortho or establish care with PCP.   Discussed strict return precautions. Patient verbalized understanding and is agreeable with plan.   Final Clinical Impressions(s) / UC Diagnoses   Final diagnoses:  Right arm pain    ED Discharge Orders    None       Controlled Substance Prescriptions [place] Controlled Substance Registry consulted? Not Applicable   Lew DawesWieters, Kenyada Dosch C, [place]-C 11/26/17 1314

## 2017-11-26 NOTE — Discharge Instructions (Signed)
Please continue Tylenol and ibuprofen for ear pain.  You may take up to 800 ibuprofen and supplement with [phone] of Tylenol.  Please follow-up with orthopedics or get set up with the primary care community [facility] for further management and evaluation.

## 2018-01-21 ENCOUNTER — Encounter (HOSPITAL_COMMUNITY): Payer: Self-pay | Admitting: *Deleted

## 2018-01-21 ENCOUNTER — Other Ambulatory Visit: Payer: Self-pay

## 2018-01-21 ENCOUNTER — Emergency Department (HOSPITAL_COMMUNITY)
Admission: EM | Admit: 2018-01-21 | Discharge: 2018-01-21 | Disposition: A | Discharge status: Home / Self Care | Payer: Self-pay | Attending: Emergency Medicine | Admitting: Emergency Medicine

## 2018-01-21 DIAGNOSIS — Z7984 Long term (current) use of oral hypoglycemic drugs: Secondary | ICD-10-CM | POA: Insufficient documentation

## 2018-01-21 DIAGNOSIS — M7918 Myalgia, other site: Secondary | ICD-10-CM

## 2018-01-21 DIAGNOSIS — R739 Hyperglycemia, unspecified: Secondary | ICD-10-CM

## 2018-01-21 DIAGNOSIS — M79601 Pain in right arm: Secondary | ICD-10-CM | POA: Insufficient documentation

## 2018-01-21 DIAGNOSIS — I10 Essential (primary) hypertension: Secondary | ICD-10-CM | POA: Insufficient documentation

## 2018-01-21 DIAGNOSIS — R202 Paresthesia of skin: Secondary | ICD-10-CM | POA: Insufficient documentation

## 2018-01-21 DIAGNOSIS — E1165 Type 2 diabetes mellitus with hyperglycemia: Secondary | ICD-10-CM | POA: Insufficient documentation

## 2018-01-21 DIAGNOSIS — Z79899 Other long term (current) drug therapy: Secondary | ICD-10-CM | POA: Insufficient documentation

## 2018-01-21 LAB — CBG MONITORING, ED: Glucose-Capillary: 168 mg/dL — ABNORMAL HIGH (ref 65–99)

## 2018-01-21 NOTE — ED Provider Notes (Signed)
[facility] EMERGENCY DEPARTMENT Provider Note   CSN: [id number] Arrival date & time: 01/21/18  4098     History   Chief Complaint Chief Complaint  Patient presents with  . Arm Pain    HPI Chase Mccormick is a 52 y.o. male.  HPI   52 year old male presents today with several complaints.  Patient reports right-sided arm pain.  He notes this is been several months in duration with the right bicep causing pain.  He notes this is worse with cross body movements, not significantly worse with flexion or extension of the elbow.  He denies any pain in the shoulder or neck, denies any loss of distal sensation strength and motor function to the extremity.  He denies any trauma to this area.  He was seen by urgent care recently for this but did not follow-up as an outpatient and has had no change in his symptoms.  Patient also reports he is having tingling in the left second and third digits that started yesterday.  Patient denies any numbness or tingling in the remainder of extremity, denies any pain to the extremity, denies any other neurological deficits.  Past Medical History:  Diagnosis Date  . Diabetes mellitus   . High cholesterol   . Hypertension     There are no active problems to display for this patient.   History reviewed. No pertinent surgical history.      Home Medications    Prior to Admission medications   Medication Sig Start Date End Date Taking? Authorizing Provider  glipiZIDE (GLUCOTROL) 10 MG tablet Take 1 tablet (10 mg total) by mouth 2 (two) times daily before a meal. 11/03/17 12/03/17  Robinson, [place] N, PA-C  lisinopril (PRINIVIL,ZESTRIL) 10 MG tablet Take 1 tablet (10 mg total) by mouth daily. 11/03/17   Robinson, [place] N, PA-C  metFORMIN (GLUCOPHAGE) 1000 MG tablet Take 1 tablet (1,000 mg total) by mouth 2 (two) times daily with a meal. 11/03/17   Robinson, [place] N, PA-C  pioglitazone (ACTOS) 15 MG tablet Take 1 tablet (15 mg total)  by mouth daily. 11/03/17   Robinson, [place] N, PA-C  pravastatin (PRAVACHOL) 10 MG tablet Take 1 tablet (10 mg total) by mouth daily. 11/03/17   Robinson, [place] N, PA-C    Family History Family History  Problem Relation Age of Onset  . Ulcers Mother     Social History Social History   Tobacco Use  . Smoking status: Never Smoker  . Smokeless tobacco: Never Used  Substance Use Topics  . Alcohol use: No    Comment: stopped 3 years ago  . Drug use: No     Allergies   Patient has no known allergies.   Review of Systems Review of Systems  All other systems reviewed and are negative.  Physical Exam Updated Vital Signs BP 118/87 (BP Location: Right Arm)   Pulse 78   Temp 98.2 F (36.8 C) (Oral)   Resp 16   SpO2 99%   Physical Exam  Constitutional: He is oriented to person, place, and time. He appears well-developed and well-nourished.  HENT:  Head: Normocephalic and atraumatic.  Eyes: Pupils are equal, round, and reactive to light. Conjunctivae are normal. Right eye exhibits no discharge. Left eye exhibits no discharge. No scleral icterus.  Neck: Normal range of motion. No JVD present. No tracheal deviation present.  Pulmonary/Chest: Effort normal. No stridor.  Musculoskeletal:  No CT or L-spine tenderness palpation, no pain with axial loading of the cervical  spine neck full active range of motion nontender bilateral upper and lower extremity nontender to palpation full active ROM -sensation intact to bilateral upper extremities  Neurological: He is alert and oriented to person, place, and time. Coordination normal.  Psychiatric: He has a normal mood and affect. His behavior is normal. Judgment and thought content normal.  Nursing note and vitals reviewed.    ED Treatments / Results  Labs (all labs ordered are listed, but only abnormal results are displayed) Labs Reviewed  CBG MONITORING, ED - Abnormal; Notable for the following components:      Result Value    Glucose-Capillary 168 (*)    All other components within normal limits    EKG None  Radiology No results found.  Procedures Procedures (including critical care time)  Medications Ordered in ED Medications - No data to display   Initial Impression / Assessment and Plan / ED Course  I have reviewed the triage vital signs and the nursing notes.  Pertinent labs & imaging results that were available during my care of the patient were reviewed by me and considered in my medical decision making (see chart for details).       Final Clinical Impressions(s) / ED Diagnoses   Final diagnoses:  Musculoskeletal pain  Hyperglycemia  Paresthesia    Labs: [facility] CBG 168  Imaging:  Consults:  Therapeutics:  Discharge Meds:   Assessment/Plan: 52 year old male presents today with likely musculoskeletal pain and paresthesias.  Patient has been seen before in the past for this with no significant changes right side no neurological deficits were associated with the pain.  Anti-inflammatories encouraged, outpatient follow-up encouraged for this.  Patient having paresthesias in his right 2 fingers, no tenderness, no strength deficits, no loss of sensation, again outpatient follow-up indicated as I have very low suspicion for central cause given no other neurological deficits, no pain indicating significant neuro impingement.  Patient also requesting blood sugar check as he is a diabetic.  Blood sugar checked here reading 168, patient does have appropriate medications and is encouraged to use them as directed.  Patient is given strict return precautions, verbalized understanding and agreement to today's plan had no further questions or concerns.     ED Discharge Orders    None       Eyvonne MechanicHedges, Ivet Guerrieri, Cordelia Poche-C 01/21/18 1332    Pricilla LovelessGoldston, Scott, MD 01/21/18 2253

## 2018-01-21 NOTE — Discharge Instructions (Addendum)
Please read attached information. If you experience any new or worsening signs or symptoms please return to the emergency room for evaluation. Please follow-up with your primary care provider or specialist as discussed.  °

## 2018-01-21 NOTE — ED Triage Notes (Signed)
Pt in c/o tingling to his right arm for the last month, pain when lifting it, yesterday started c/o tingling to his ring finger on his left hand, denies pain, no deficits noted

## 2018-02-17 ENCOUNTER — Ambulatory Visit (HOSPITAL_COMMUNITY)
Admission: EM | Admit: 2018-02-17 | Discharge: 2018-02-17 | Disposition: A | Discharge status: Home / Self Care | Payer: Self-pay | Attending: Urgent Care | Admitting: Urgent Care

## 2018-02-17 ENCOUNTER — Encounter (HOSPITAL_COMMUNITY): Payer: Self-pay | Admitting: Emergency Medicine

## 2018-02-17 DIAGNOSIS — I10 Essential (primary) hypertension: Secondary | ICD-10-CM

## 2018-02-17 DIAGNOSIS — E785 Hyperlipidemia, unspecified: Secondary | ICD-10-CM

## 2018-02-17 DIAGNOSIS — E119 Type 2 diabetes mellitus without complications: Secondary | ICD-10-CM

## 2018-02-17 DIAGNOSIS — IMO0001 Reserved for inherently not codable concepts without codable children: Secondary | ICD-10-CM

## 2018-02-17 DIAGNOSIS — Z760 Encounter for issue of repeat prescription: Secondary | ICD-10-CM

## 2018-02-17 DIAGNOSIS — E782 Mixed hyperlipidemia: Secondary | ICD-10-CM

## 2018-02-17 DIAGNOSIS — E1165 Type 2 diabetes mellitus with hyperglycemia: Secondary | ICD-10-CM

## 2018-02-17 LAB — POCT I-STAT, CHEM 8
BUN: 15 mg/dL (ref 6–20)
CALCIUM ION: 1.16 mmol/L (ref 1.15–1.40)
CHLORIDE: 100 mmol/L — AB (ref 101–111)
Creatinine, Ser: 0.8 mg/dL (ref 0.61–1.24)
Glucose, Bld: 280 mg/dL — ABNORMAL HIGH (ref 65–99)
HCT: 43 % (ref 39.0–52.0)
Hemoglobin: 14.6 g/dL (ref 13.0–17.0)
POTASSIUM: 3.4 mmol/L — AB (ref 3.5–5.1)
SODIUM: 139 mmol/L (ref 135–145)
TCO2: 26 mmol/L (ref 22–32)

## 2018-02-17 MED ORDER — METFORMIN HCL 1000 MG PO TABS: 1000.0000 mg | ORAL_TABLET | Freq: Two times a day (BID) | ORAL | 0 refills | Status: DC

## 2018-02-17 MED ORDER — LISINOPRIL 10 MG PO TABS: 10.0000 mg | ORAL_TABLET | Freq: Every day | ORAL | 0 refills | Status: DC

## 2018-02-17 MED ORDER — GLIPIZIDE 5 MG PO TABS: 5.0000 mg | ORAL_TABLET | Freq: Two times a day (BID) | ORAL | 0 refills | Status: DC

## 2018-02-17 MED ORDER — PRAVASTATIN SODIUM 10 MG PO TABS: 10.0000 mg | ORAL_TABLET | Freq: Every day | ORAL | 0 refills | Status: DC

## 2018-02-17 NOTE — ED Triage Notes (Signed)
Pt c/o being out of his meds x1 month, pt on numerous meds for HTN and diabetes.

## 2018-02-17 NOTE — Discharge Instructions (Addendum)
Metformin Dosing (to be taken with food) Week 1: take 1/2 tablet twice a day. Week 2: take 1 tablet in the morning, 1/2 tablet at night. Week 3: take 1 tablets twice a day.  Glipizide Week 1: Start with 1 tablet once daily. Week 2: Increase to 1 tablet twice daily.   Please make sure that you are eating less or limit your carbohydrates such as white rice, potatoes, white breads, pasta, pizza, sodas, beer, sweet tea, fruit juices. Exercise can also help with your blood sugar. You can instead eat more salads, fruits, vegetables, whole grains, wheat, oats.

## 2018-02-17 NOTE — ED Provider Notes (Signed)
MRN: [id number] DOB: Sep 13, 1966  Subjective:   Chase Mccormick is a 52 y.o. male presenting for medication refill.  He is in the process of obtaining a new PCP.  He does not have any of his medications and has not had them for months.  Denies smoking cigarettes or drinking alcohol. Denies dizziness, chronic headache, blurred vision, chest pain, shortness of breath, heart racing, palpitations, nausea, vomiting, abdominal pain, hematuria, lower leg swelling.  Denies polydipsia, polyuria, skin infections.  No current facility-administered medications for this encounter.   Current Outpatient Medications:  .  glipiZIDE (GLUCOTROL) 10 MG tablet, Take 1 tablet (10 mg total) by mouth 2 (two) times daily before a meal., Disp: 30 tablet, Rfl: 1 .  lisinopril (PRINIVIL,ZESTRIL) 10 MG tablet, Take 1 tablet (10 mg total) by mouth daily., Disp: 30 tablet, Rfl: 1 .  metFORMIN (GLUCOPHAGE) 1000 MG tablet, Take 1 tablet (1,000 mg total) by mouth 2 (two) times daily with a meal., Disp: 60 tablet, Rfl: 1 .  pioglitazone (ACTOS) 15 MG tablet, Take 1 tablet (15 mg total) by mouth daily., Disp: 30 tablet, Rfl: 1 .  pravastatin (PRAVACHOL) 10 MG tablet, Take 1 tablet (10 mg total) by mouth daily., Disp: 30 tablet, Rfl: 1    No Known Allergies   Past Medical History:  Diagnosis Date  . Diabetes mellitus   . High cholesterol   . Hypertension      Denies past surgical history.   Objective:   Vitals: BP (!) 161/70   Pulse 80   Temp 98.8 F (37.1 C)   Resp 18   SpO2 100%   BP Readings from Last 3 Encounters:  02/17/18 (!) 161/70  01/21/18 118/87  11/26/17 130/80   Physical Exam  Constitutional: He is oriented to person, place, and time. He appears well-developed and well-nourished.  HENT:  Mouth/Throat: Oropharynx is clear and moist.  Eyes: Pupils are equal, round, and reactive to light. EOM are normal. No scleral icterus.  Cardiovascular: Normal rate, regular rhythm and intact distal  pulses. Exam reveals no gallop and no friction rub.  No murmur heard. Pulmonary/Chest: No respiratory distress. He has no wheezes. He has no rales.  Abdominal: Soft. Bowel sounds are normal. He exhibits no distension and no mass. There is no tenderness. There is no rebound and no guarding.  Neurological: He is alert and oriented to person, place, and time.  Skin: Skin is warm and dry.  Psychiatric: He has a normal mood and affect.   Results for orders placed or performed during the hospital encounter of 02/17/18 (from the past 24 hour(s))  I-STAT, chem 8     Status: Abnormal   Collection Time: 02/17/18  8:38 PM  Result Value Ref Range   Sodium 139 135 - 145 mmol/L   Potassium 3.4 (L) 3.5 - 5.1 mmol/L   Chloride 100 (L) 101 - 111 mmol/L   BUN 15 6 - 20 mg/dL   Creatinine, Ser 8.65 0.61 - 1.24 mg/dL   Glucose, Bld 784 (H) 65 - 99 mg/dL   Calcium, Ion 6.96 2.95 - 1.40 mmol/L   TCO2 26 22 - 32 mmol/L   Hemoglobin 14.6 13.0 - 17.0 g/dL   HCT 28.4 13.2 - 44.0 %    Assessment and Plan :   Uncontrolled diabetes mellitus type 2 without complications (HCC)  Mixed hyperlipidemia  Essential hypertension  I refilled his metformin and glipizide.  Provided patient with dosing instructions.  Counseled him on lifestyle modifications and dietary compliance with  his diabetes.  Also refilled his lisinopril for his high blood pressure, pravastatin for his cholesterol.  I declined to refill his Actos until he can establish with PCP.  Patient verbalized understanding. Counseled patient on potential for adverse effects with medications prescribed today, patient verbalized understanding.    Wallis Bamberg, [place] 02/17/18 2046

## 2018-04-15 ENCOUNTER — Ambulatory Visit (HOSPITAL_COMMUNITY)
Admission: EM | Admit: 2018-04-15 | Discharge: 2018-04-15 | Disposition: A | Discharge status: Home / Self Care | Payer: Self-pay | Attending: Family Medicine | Admitting: Family Medicine

## 2018-04-15 ENCOUNTER — Encounter (HOSPITAL_COMMUNITY): Payer: Self-pay | Admitting: Emergency Medicine

## 2018-04-15 DIAGNOSIS — M79601 Pain in right arm: Secondary | ICD-10-CM

## 2018-04-15 LAB — GLUCOSE, CAPILLARY: Glucose-Capillary: 210 mg/dL — ABNORMAL HIGH (ref 70–99)

## 2018-04-15 MED ORDER — NAPROXEN 375 MG PO TABS: 375.0000 mg | ORAL_TABLET | Freq: Two times a day (BID) | ORAL | 0 refills | Status: DC

## 2018-04-15 NOTE — ED Provider Notes (Signed)
[facility]    CSN: [id number] Arrival date & time: 04/15/18  1629     History   Chief Complaint Chief Complaint  Patient presents with  . Arm Pain    HPI Chase Mccormick is a 52 y.o. male.   Chase Mccormick presents with complaints of right arm pain. This is pain from the elbow up to shoulder. 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 He is right handed. States it has been ongoing for two months. Per chart review has been seen for this before, even back in January of this year. No specific injury, states he has fallen on it twice however. Pain is sharp. Rates it currently 5/10. In the morning it is 9/10. Does not take any medications for pain. No numbness or tingling, no weakness. Does not follow with a PCP. Requests a blood sugar check as he does not check it at home. States he requires a note that he was seen as his missed court today.    ROS per HPI.      Past Medical History:  Diagnosis Date  . Diabetes mellitus   . High cholesterol   . Hypertension     There are no active problems to display for this patient.   History reviewed. No pertinent surgical history.     Home Medications    Prior to Admission medications   Medication Sig Start Date End Date Taking? Authorizing Provider  glipiZIDE (GLUCOTROL) 5 MG tablet Take 1 tablet (5 mg total) by mouth 2 (two) times daily before a meal. 02/17/18   Wallis Bamberg, PA-C  lisinopril (PRINIVIL,ZESTRIL) 10 MG tablet Take 1 tablet (10 mg total) by mouth daily. 02/17/18   Wallis Bamberg, PA-C  metFORMIN (GLUCOPHAGE) 1000 MG tablet Take 1 tablet (1,000 mg total) by mouth 2 (two) times daily with a meal. 02/17/18   Wallis Bamberg, PA-C  naproxen (NAPROSYN) 375 MG tablet Take 1 tablet (375 mg total) by mouth 2 (two) times daily. 04/15/18   Linus Mako B, NP  pioglitazone (ACTOS) 15 MG tablet Take 1 tablet (15 mg total) by mouth daily. 11/03/17   Robinson, [place] N,  PA-C  pravastatin (PRAVACHOL) 10 MG tablet Take 1 tablet (10 mg total) by mouth daily. 02/17/18   Wallis Bamberg, PA-C    Family History Family History  Problem Relation Age of Onset  . Ulcers Mother     Social History Social History   Tobacco Use  . Smoking status: Never Smoker  . Smokeless tobacco: Never Used  Substance Use Topics  . Alcohol use: No    Comment: stopped 3 years ago  . Drug use: No     Allergies   Patient has no known allergies.   Review of Systems Review of Systems   Physical Exam Triage Vital Signs ED Triage Vitals [04/15/18 1649]  Enc Vitals Group     BP 139/85     Pulse Rate 63     Resp 18     Temp 98.4 F (36.9 C)     Temp Source Oral     SpO2 96 %     Weight      Height      Head Circumference      Peak Flow      Pain Score      Pain Loc      Pain Edu?      Excl. in GC?  No data found.  Updated Vital Signs BP 139/85 (BP Location: Left Arm)   Pulse 63   Temp 98.4 F (36.9 C) (Oral)   Resp 18   SpO2 96%    Physical Exam  Constitutional: He is oriented to person, place, and time. He appears well-developed and well-nourished.  Cardiovascular: Normal rate and regular rhythm.  Pulmonary/Chest: Effort normal and breath sounds normal.  Musculoskeletal:       Right shoulder: He exhibits pain. He exhibits normal range of motion, no tenderness, no bony tenderness, no swelling, no effusion, no crepitus, no deformity, no laceration, no spasm, normal pulse and normal strength.       Right elbow: Normal.      Right wrist: Normal.       Right upper arm: He exhibits tenderness. He exhibits no bony tenderness, no swelling, no edema, no deformity and no laceration.       Right forearm: Normal.  Moving right arm in all directions during history; full ROM on exam; indications some proximal bicep pain with shoulder external rotation; right bicep with generalized tenderness without point tenderness; strength equal to bilateral upper extremities;  sensation intact; strong radial pulses   Neurological: He is alert and oriented to person, place, and time.  Skin: Skin is warm and dry.     UC Treatments / Results  Labs (all labs ordered are listed, but only abnormal results are displayed) Labs Reviewed - No data to display  EKG None  Radiology No results found.  Procedures Procedures (including critical care time)  Medications Ordered in UC Medications - No data to display  Initial Impression / Assessment and Plan / UC Course  I have reviewed the triage vital signs and the nursing notes.  Pertinent labs & imaging results that were available during my care of the patient were reviewed by me and considered in my medical decision making (see chart for details).     Negative humerus xray in February of this year. No new injury. Appears to be chronic in nature. Without acute findings on exam. Full ROM present. Naproxen bid for pain. Encouraged follow up with PCP for management. Patient verbalized understanding and agreeable to plan.    Final Clinical Impressions(s) / UC Diagnoses   Final diagnoses:  Right arm pain     Discharge Instructions     Ice application to right right.  Naproxen, twice a day, take with food.  Please establish with a primary care provider for recheck of symptoms and management of your chronic medications.    ED Prescriptions    Medication Sig Dispense Auth. Provider   naproxen (NAPROSYN) 375 MG tablet Take 1 tablet (375 mg total) by mouth 2 (two) times daily. 20 tablet Georgetta HaberBurky, Quashawn Jewkes B, NP     Controlled Substance Prescriptions [place] Controlled Substance Registry consulted? Not Applicable   Georgetta HaberBurky, Devani Odonnel B, NP 04/15/18 1718

## 2018-04-15 NOTE — Discharge Instructions (Signed)
Ice application to right right.  Naproxen, twice a day, take with food.  Please establish with a primary care provider for recheck of symptoms and management of your chronic medications.

## 2018-04-15 NOTE — ED Triage Notes (Signed)
Pt sts right arm pain x weeks in upper arm and shoulder area; pt requesting note for court

## 2018-08-01 ENCOUNTER — Encounter (HOSPITAL_COMMUNITY): Payer: Self-pay | Admitting: Emergency Medicine

## 2018-08-01 ENCOUNTER — Other Ambulatory Visit: Payer: Self-pay

## 2018-08-01 ENCOUNTER — Emergency Department (HOSPITAL_COMMUNITY)
Admission: EM | Admit: 2018-08-01 | Discharge: 2018-08-01 | Disposition: A | Discharge status: Home / Self Care | Payer: Self-pay | Attending: Emergency Medicine | Admitting: Emergency Medicine

## 2018-08-01 DIAGNOSIS — Z79899 Other long term (current) drug therapy: Secondary | ICD-10-CM | POA: Insufficient documentation

## 2018-08-01 DIAGNOSIS — R739 Hyperglycemia, unspecified: Secondary | ICD-10-CM

## 2018-08-01 DIAGNOSIS — E1165 Type 2 diabetes mellitus with hyperglycemia: Secondary | ICD-10-CM | POA: Insufficient documentation

## 2018-08-01 DIAGNOSIS — Z7984 Long term (current) use of oral hypoglycemic drugs: Secondary | ICD-10-CM | POA: Insufficient documentation

## 2018-08-01 DIAGNOSIS — Z9114 Patient's other noncompliance with medication regimen: Secondary | ICD-10-CM

## 2018-08-01 DIAGNOSIS — I10 Essential (primary) hypertension: Secondary | ICD-10-CM | POA: Insufficient documentation

## 2018-08-01 LAB — CBC WITH DIFFERENTIAL/PLATELET
ABS IMMATURE GRANULOCYTES: 0.01 10*3/uL (ref 0.00–0.07)
BASOS ABS: 0 10*3/uL (ref 0.0–0.1)
BASOS PCT: 0 %
EOS ABS: 0.1 10*3/uL (ref 0.0–0.5)
Eosinophils Relative: 2 %
HCT: 43.1 % (ref 39.0–52.0)
Hemoglobin: 13.5 g/dL (ref 13.0–17.0)
IMMATURE GRANULOCYTES: 0 %
Lymphocytes Relative: 51 %
Lymphs Abs: 2.4 10*3/uL (ref 0.7–4.0)
MCH: 27.4 pg (ref 26.0–34.0)
MCHC: 31.3 g/dL (ref 30.0–36.0)
MCV: 87.6 fL (ref 80.0–100.0)
Monocytes Absolute: 0.4 10*3/uL (ref 0.1–1.0)
Monocytes Relative: 9 %
NEUTROS ABS: 1.8 10*3/uL (ref 1.7–7.7)
NRBC: 0 % (ref 0.0–0.2)
Neutrophils Relative %: 38 %
PLATELETS: 281 10*3/uL (ref 150–400)
RBC: 4.92 MIL/uL (ref 4.22–5.81)
RDW: 12.2 % (ref 11.5–15.5)
WBC: 4.7 10*3/uL (ref 4.0–10.5)

## 2018-08-01 LAB — BASIC METABOLIC PANEL
Anion gap: 9 (ref 5–15)
BUN: 13 mg/dL (ref 6–20)
CALCIUM: 9.4 mg/dL (ref 8.9–10.3)
CO2: 23 mmol/L (ref 22–32)
Chloride: 105 mmol/L (ref 98–111)
Creatinine, Ser: 0.87 mg/dL (ref 0.61–1.24)
Glucose, Bld: 254 mg/dL — ABNORMAL HIGH (ref 70–99)
POTASSIUM: 3.8 mmol/L (ref 3.5–5.1)
SODIUM: 137 mmol/L (ref 135–145)

## 2018-08-01 MED ORDER — PRAVASTATIN SODIUM 10 MG PO TABS: 10.0000 mg | ORAL_TABLET | Freq: Every day | ORAL | 0 refills | Status: DC

## 2018-08-01 MED ORDER — HYDROCHLOROTHIAZIDE 25 MG PO TABS: 12.5000 mg | ORAL_TABLET | Freq: Every day | ORAL | 0 refills | Status: DC

## 2018-08-01 MED ORDER — LISINOPRIL 20 MG PO TABS: 20.0000 mg | ORAL_TABLET | Freq: Every day | ORAL | 0 refills | Status: DC

## 2018-08-01 MED ORDER — METFORMIN HCL 1000 MG PO TABS: 1000.0000 mg | ORAL_TABLET | Freq: Two times a day (BID) | ORAL | 0 refills | Status: DC

## 2018-08-01 MED ORDER — PIOGLITAZONE HCL 15 MG PO TABS: 15.0000 mg | ORAL_TABLET | Freq: Every day | ORAL | 1 refills | Status: DC

## 2018-08-01 MED ORDER — GLIPIZIDE 5 MG PO TABS: 5.0000 mg | ORAL_TABLET | Freq: Two times a day (BID) | ORAL | 0 refills | Status: DC

## 2018-08-01 NOTE — Discharge Instructions (Signed)
We are changing your blood pressure medicine, increasing the dose and adding a diuretic which should help with the blood pressure and swelling.  Also stay on a low-salt diet as instructed with the attached eating plan.  We have refilled your prescriptions by sending them to your pharmacy.  It is important to find a primary care doctor, use the resource guide to help you do that.  They will need to refill your medicines in the future.

## 2018-08-01 NOTE — ED Triage Notes (Signed)
Pt. Stated, this morning my feet were swollen. Something new.

## 2018-08-01 NOTE — ED Provider Notes (Signed)
[facility] EMERGENCY DEPARTMENT Provider Note   CSN: [id number] Arrival date & time: 08/01/18  0841     History   Chief Complaint Chief Complaint  Patient presents with  . Foot Swelling  . Medication Refill    HPI Chase Mccormick is a 52 y.o. male.  HPI   Resents for evaluation of swelling in his feet which he first noticed today.  Became concerned that it was a kidney problem so came here.  He states he is out of his medicines with the exception of his lisinopril.  Not currently have a PCP.  He denies headache, chest pain, focal weakness or paresthesia.  He has been eating well.  He is homeless, and sleeps in his car.  There are no other known modifying factors.  Past Medical History:  Diagnosis Date  . Diabetes mellitus   . High cholesterol   . Hypertension     There are no active problems to display for this patient.   History reviewed. No pertinent surgical history.      Home Medications    Prior to Admission medications   Medication Sig Start Date End Date Taking? Authorizing Provider  glipiZIDE (GLUCOTROL) 5 MG tablet Take 1 tablet (5 mg total) by mouth 2 (two) times daily before a meal. 08/01/18   Mancel Bale, MD  hydrochlorothiazide (HYDRODIURIL) 25 MG tablet Take 0.5 tablets (12.5 mg total) by mouth daily. 08/01/18   Mancel Bale, MD  lisinopril (PRINIVIL,ZESTRIL) 20 MG tablet Take 1 tablet (20 mg total) by mouth daily. 08/01/18   Mancel Bale, MD  metFORMIN (GLUCOPHAGE) 1000 MG tablet Take 1 tablet (1,000 mg total) by mouth 2 (two) times daily with a meal. 08/01/18   Mancel Bale, MD  naproxen (NAPROSYN) 375 MG tablet Take 1 tablet (375 mg total) by mouth 2 (two) times daily. 04/15/18   Linus Mako B, NP  pioglitazone (ACTOS) 15 MG tablet Take 1 tablet (15 mg total) by mouth daily. 08/01/18   Mancel Bale, MD  pravastatin (PRAVACHOL) 10 MG tablet Take 1 tablet (10 mg total) by mouth daily. 08/01/18   Mancel Bale, MD      Family History Family History  Problem Relation Age of Onset  . Ulcers Mother     Social History Social History   Tobacco Use  . Smoking status: Never Smoker  . Smokeless tobacco: Never Used  Substance Use Topics  . Alcohol use: No    Comment: stopped 3 years ago  . Drug use: No     Allergies   Patient has no known allergies.   Review of Systems Review of Systems  All other systems reviewed and are negative.    Physical Exam Updated Vital Signs BP (!) 175/92 (BP Location: Right Arm)   Pulse 62   Temp 98.7 F (37.1 C) (Oral)   Resp 18   SpO2 99%   Physical Exam  Constitutional: He is oriented to person, place, and time. He appears well-developed and well-nourished. No distress.  HENT:  Head: Normocephalic and atraumatic.  Right Ear: External ear normal.  Left Ear: External ear normal.  Eyes: Pupils are equal, round, and reactive to light. Conjunctivae and EOM are normal.  Neck: Normal range of motion and phonation normal. Neck supple.  Cardiovascular: Normal rate, regular rhythm and normal heart sounds.  Pulmonary/Chest: Effort normal and breath sounds normal. He exhibits no bony tenderness.  Abdominal: Soft. There is no tenderness.  Musculoskeletal: Normal range of motion. He exhibits edema (Mild  bilateral lower leg and feet). He exhibits no tenderness or deformity.  Neurological: He is alert and oriented to person, place, and time. No cranial nerve deficit or sensory deficit. He exhibits normal muscle tone. Coordination normal.  Skin: Skin is warm, dry and intact.  Psychiatric: He has a normal mood and affect. His behavior is normal. Judgment and thought content normal.  Nursing note and vitals reviewed.    ED Treatments / Results  Labs (all labs ordered are listed, but only abnormal results are displayed) Labs Reviewed  BASIC METABOLIC PANEL - Abnormal; Notable for the following components:      Result Value   Glucose, Bld 254 (*)    All other  components within normal limits  CBC WITH DIFFERENTIAL/PLATELET    EKG None  Radiology No results found.  Procedures Procedures (including critical care time)  Medications Ordered in ED Medications - No data to display   Initial Impression / Assessment and Plan / ED Course  I have reviewed the triage vital signs and the nursing notes.  Pertinent labs & imaging results that were available during my care of the patient were reviewed by me and considered in my medical decision making (see chart for details).      Patient Vitals for the past 24 hrs:  BP Temp Temp src Pulse Resp SpO2  08/01/18 0856 (!) 175/92 98.7 F (37.1 C) Oral 62 18 99 %    10:05 AM Reevaluation with update and discussion. After initial assessment and treatment, an updated evaluation reveals no change in clinical status.  Findings discussed with the patient and all questions were answered. Mancel Bale   Medical Decision Making: Medication noncompliance, with mild hypertension, undertreated.  Suspect nutritional component to blood pressure and edema.  Doubt ACS, congestive heart failure, metabolic instability or impending vascular collapse.  CRITICAL CARE-no Performed by: Mancel Bale  Nursing Notes Reviewed/ Care Coordinated Applicable Imaging Reviewed Interpretation of Laboratory Data incorporated into ED treatment  The patient appears reasonably screened and/or stabilized for discharge and I doubt any other medical condition or other [facility] requiring further screening, evaluation, or treatment in the ED at this time prior to discharge.  Plan: Home Medications-refilled usual medicines, increase lisinopril and add HCTZ; Home Treatments-low-salt diet; return here if the recommended treatment, does not improve the symptoms; Recommended follow up-PCP follow-up for ongoing management   Final Clinical Impressions(s) / ED Diagnoses   Final diagnoses:  Hypertension, unspecified type  Hyperglycemia    Noncompliance with medication regimen    ED Discharge Orders         Ordered    glipiZIDE (GLUCOTROL) 5 MG tablet  2 times daily before meals     08/01/18 1010    lisinopril (PRINIVIL,ZESTRIL) 20 MG tablet  Daily     08/01/18 1010    metFORMIN (GLUCOPHAGE) 1000 MG tablet  2 times daily with meals     08/01/18 1010    pioglitazone (ACTOS) 15 MG tablet  Daily     08/01/18 1010    pravastatin (PRAVACHOL) 10 MG tablet  Daily     08/01/18 1010    hydrochlorothiazide (HYDRODIURIL) 25 MG tablet  Daily     08/01/18 1010           Mancel Bale, MD 08/01/18 1014

## 2018-09-05 IMAGING — DX DG HUMERUS 2V *R*
2 series · 2 of 2 positions shown · non-contrast
Comparison: None.

CLINICAL DATA: Right arm pain after fall 3 weeks ago.

EXAM:
RIGHT HUMERUS - 2+ VIEW

[humerus ap]
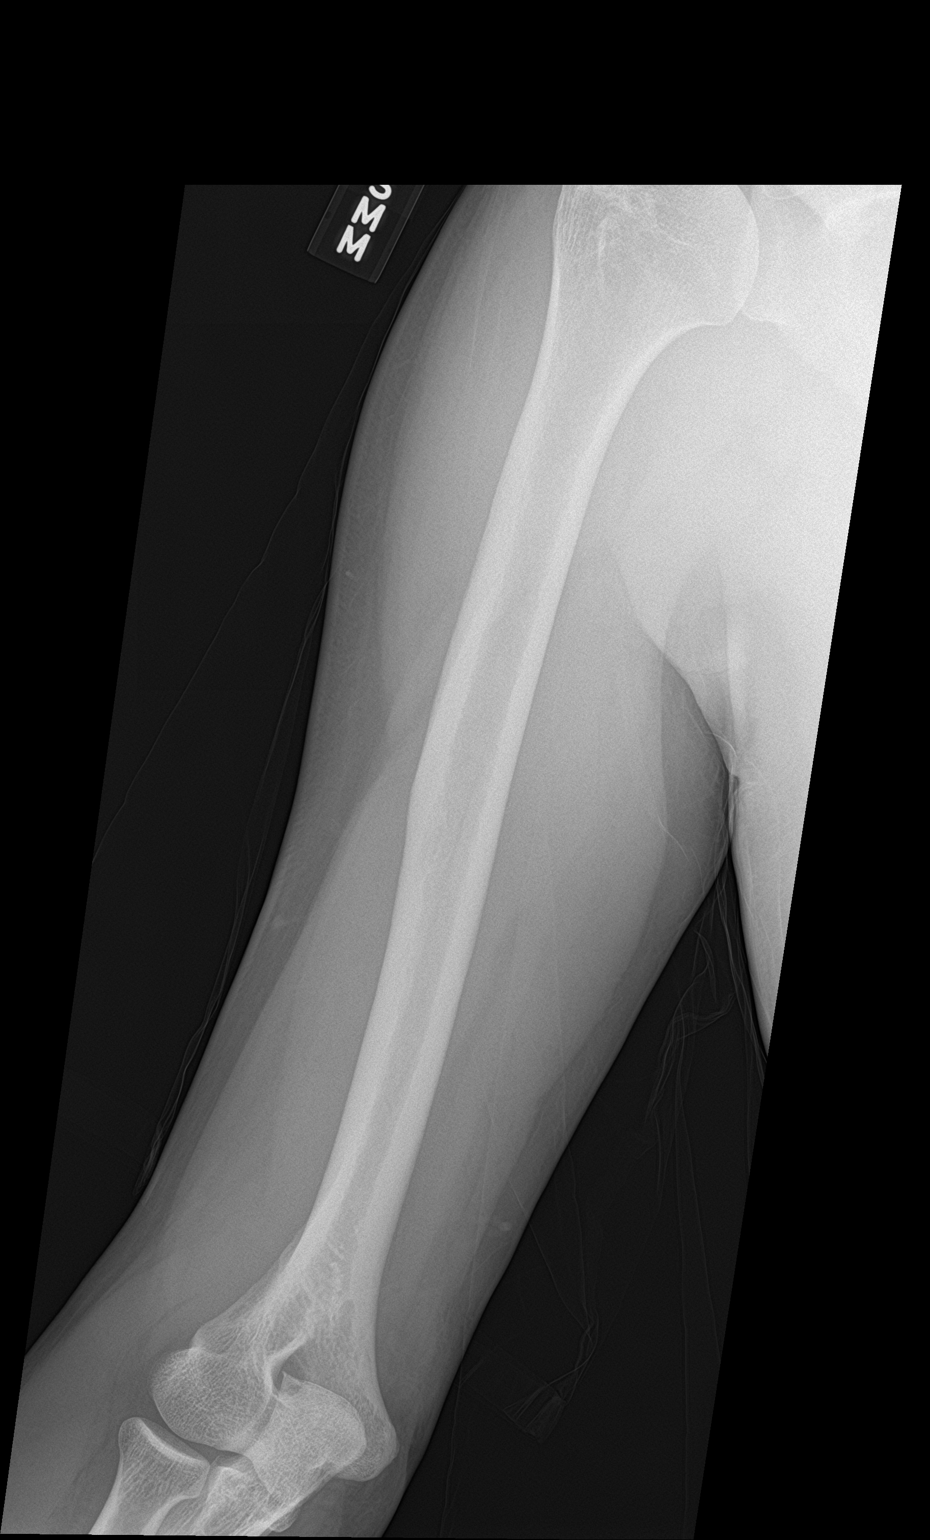

[humerus lat]
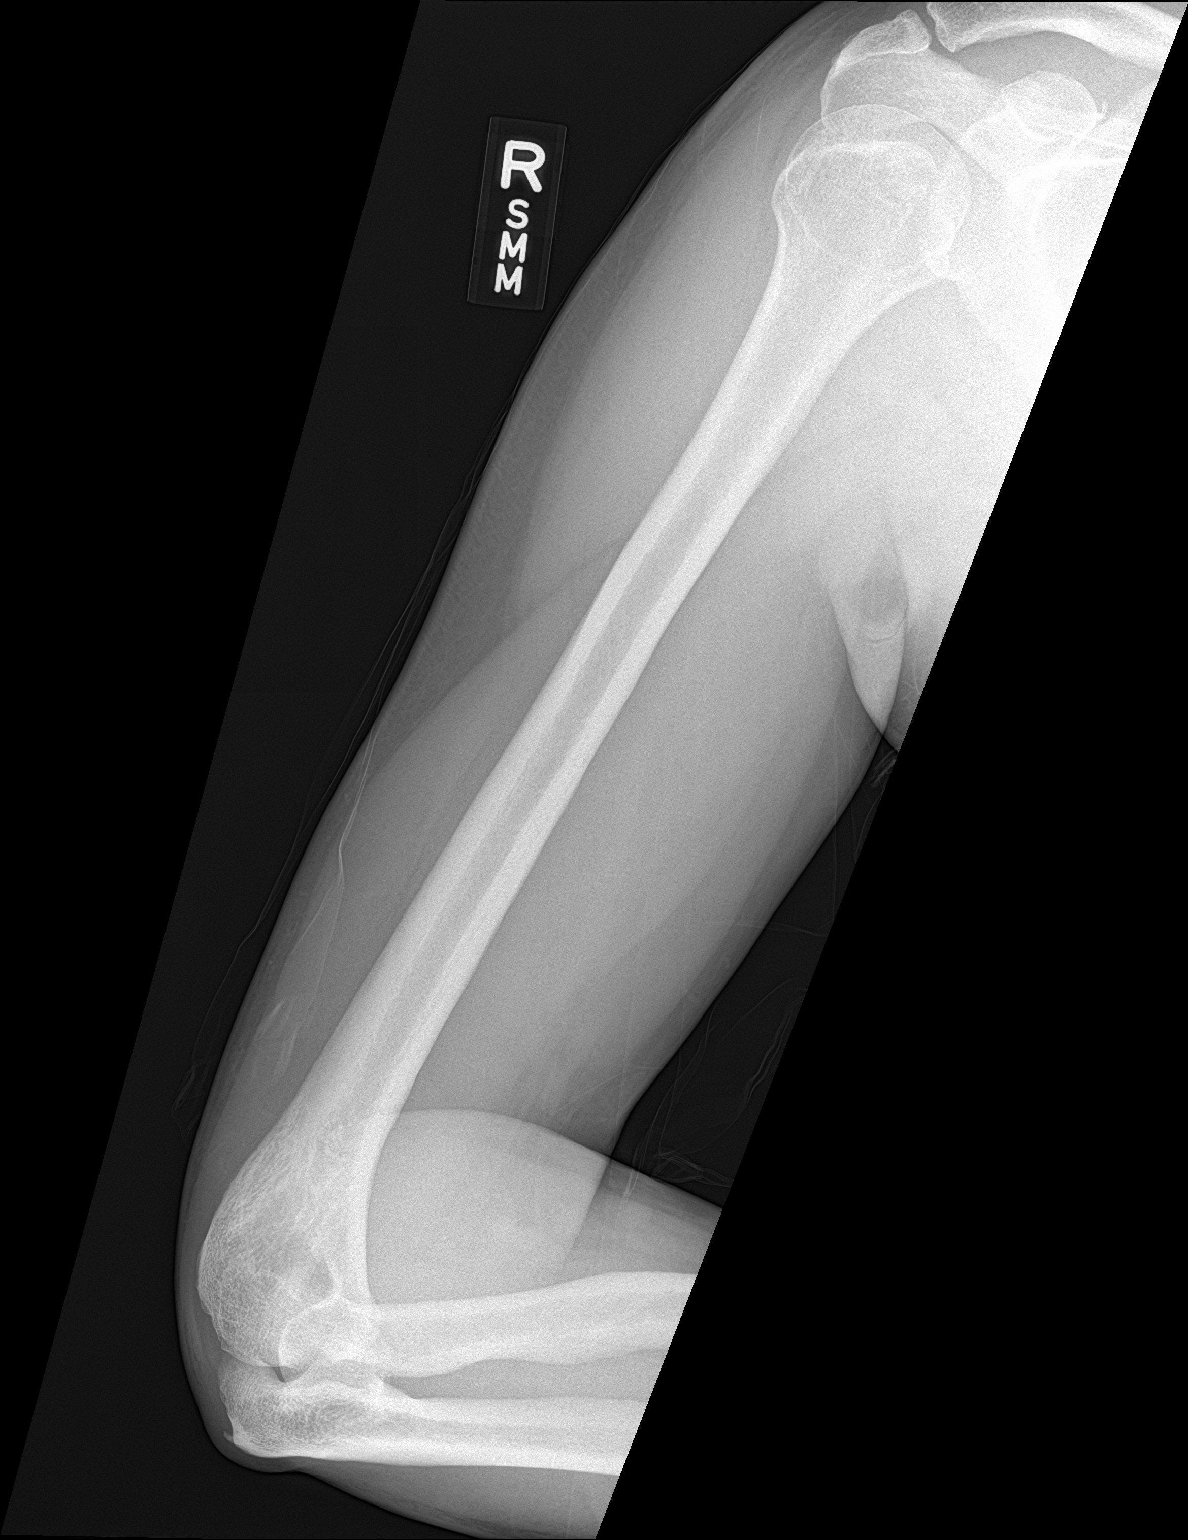

[2 of 2 positions shown; findings below may reference images not displayed]

FINDINGS: There is no evidence of fracture or other focal bone lesions. Soft
tissues are unremarkable.
IMPRESSION: Normal right humerus.

## 2018-11-05 ENCOUNTER — Emergency Department (HOSPITAL_COMMUNITY): Payer: Self-pay

## 2018-11-05 ENCOUNTER — Emergency Department (HOSPITAL_COMMUNITY)
Admission: EM | Admit: 2018-11-05 | Discharge: 2018-11-05 | Disposition: A | Discharge status: Home / Self Care | Payer: Self-pay | Attending: Emergency Medicine | Admitting: Emergency Medicine

## 2018-11-05 DIAGNOSIS — G8929 Other chronic pain: Secondary | ICD-10-CM | POA: Insufficient documentation

## 2018-11-05 DIAGNOSIS — R531 Weakness: Secondary | ICD-10-CM | POA: Insufficient documentation

## 2018-11-05 DIAGNOSIS — Z7984 Long term (current) use of oral hypoglycemic drugs: Secondary | ICD-10-CM | POA: Insufficient documentation

## 2018-11-05 DIAGNOSIS — M545 Low back pain, unspecified: Secondary | ICD-10-CM

## 2018-11-05 DIAGNOSIS — R202 Paresthesia of skin: Secondary | ICD-10-CM | POA: Insufficient documentation

## 2018-11-05 DIAGNOSIS — I10 Essential (primary) hypertension: Secondary | ICD-10-CM | POA: Insufficient documentation

## 2018-11-05 DIAGNOSIS — Z79899 Other long term (current) drug therapy: Secondary | ICD-10-CM | POA: Insufficient documentation

## 2018-11-05 DIAGNOSIS — H538 Other visual disturbances: Secondary | ICD-10-CM | POA: Insufficient documentation

## 2018-11-05 DIAGNOSIS — E119 Type 2 diabetes mellitus without complications: Secondary | ICD-10-CM | POA: Insufficient documentation

## 2018-11-05 DIAGNOSIS — M25511 Pain in right shoulder: Secondary | ICD-10-CM | POA: Insufficient documentation

## 2018-11-05 LAB — I-STAT CHEM 8, ED
BUN: 10 mg/dL (ref 6–20)
CREATININE: 0.7 mg/dL (ref 0.61–1.24)
Calcium, Ion: 1.17 mmol/L (ref 1.15–1.40)
Chloride: 103 mmol/L (ref 98–111)
Glucose, Bld: 145 mg/dL — ABNORMAL HIGH (ref 70–99)
HEMATOCRIT: 42 % (ref 39.0–52.0)
Hemoglobin: 14.3 g/dL (ref 13.0–17.0)
Potassium: 4.1 mmol/L (ref 3.5–5.1)
Sodium: 140 mmol/L (ref 135–145)
TCO2: 26 mmol/L (ref 22–32)

## 2018-11-05 MED ORDER — IBUPROFEN 400 MG PO TABS: 400.0000 mg | ORAL_TABLET | Freq: Four times a day (QID) | ORAL | 0 refills | Status: DC | PRN

## 2018-11-05 MED ORDER — IBUPROFEN 400 MG PO TABS
600.0000 mg | ORAL_TABLET | Freq: Once | ORAL | Status: AC
  Administered 2018-11-05: 600 mg via ORAL
  Filled 2018-11-05: qty 1

## 2018-11-05 NOTE — ED Provider Notes (Signed)
[facility] Provider Note   CSN: [id number] Arrival date & time: 11/05/18  1342     History   Chief Complaint Chief Complaint  Patient presents with  . Back Pain  . Arm Pain    HPI Cap Remi HaggardBoubacar is a 53 y.o. male.  HPI   Antolin Remi HaggardBoubacar is a 53 y.o. male with PMH of diabetes, hypertension, HLD who presents with multiple complaints.  Initially, patient reports that his right arm is weak and has been weak for about 4 months.  He intermittently has tingling and burning pain that radiates from his right shoulder around the lateral deltoid down circumferentially around the arm if he "bumps on the wall or other objects".  His weakness has been intermittent and not consistent.  Difficult for him to predict.  Thinks that he might have weakness secondary to pain.  He has had some decreased range of motion around the shoulder as well.  No neck pain.  No headaches or fevers or chills.  Intermittently has paresthesias in his fingers and toes.  Does not have those present at this time.  No chest pain or dyspnea.  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 He states that at this time his vision feels normal.  Denies floaters and flashes of light, pain in his eye, photophobia, recent falls or trauma including impact to the eye, and drainage.  No double vision.  Patient also reports intermittent low back pain that is nonradiating and remains in his low lumbar spine.  Reports this is secondary to likely his pattern of sleep in his car recently.  He is not on insulin and has been taking all of his p.o. medications as prescribed.  Lost his job recently and has not been able to see his primary care physician because of this but states he has ample supply of his medications.  Past Medical History:  Diagnosis Date  . Diabetes mellitus   .  High cholesterol   . Hypertension     There are no active problems to display for this patient.   No past surgical history on file.      Home Medications    Prior to Admission medications   Medication Sig Start Date End Date Taking? Authorizing Provider  glipiZIDE (GLUCOTROL) 5 MG tablet Take 1 tablet (5 mg total) by mouth 2 (two) times daily before a meal. 08/01/18   Mancel BaleWentz, Elliott, MD  hydrochlorothiazide (HYDRODIURIL) 25 MG tablet Take 0.5 tablets (12.5 mg total) by mouth daily. 08/01/18   Mancel BaleWentz, Elliott, MD  ibuprofen (ADVIL,MOTRIN) 400 MG tablet Take 1 tablet (400 mg total) by mouth every 6 (six) hours as needed. 11/05/18   Tavin Vernet, Sherryle LisJames F II, MD  lisinopril (PRINIVIL,ZESTRIL) 20 MG tablet Take 1 tablet (20 mg total) by mouth daily. 08/01/18   Mancel BaleWentz, Elliott, MD  metFORMIN (GLUCOPHAGE) 1000 MG tablet Take 1 tablet (1,000 mg total) by mouth 2 (two) times daily with a meal. 08/01/18   Mancel BaleWentz, Elliott, MD  naproxen (NAPROSYN) 375 MG tablet Take 1 tablet (375 mg total) by mouth 2 (two) times daily. 04/15/18   Linus MakoBurky, Natalie B, NP  pioglitazone (ACTOS) 15 MG tablet Take 1 tablet (15 mg total) by mouth daily. 08/01/18   Mancel BaleWentz, Elliott, MD  pravastatin (PRAVACHOL) 10 MG tablet Take 1 tablet (10 mg total) by mouth daily. 08/01/18  Mancel BaleWentz, Elliott, MD    Family History Family History  Problem Relation Age of Onset  . Ulcers Mother     Social History Social History   Tobacco Use  . Smoking status: Never Smoker  . Smokeless tobacco: Never Used  Substance Use Topics  . Alcohol use: No    Comment: stopped 3 years ago  . Drug use: No     Allergies   Patient has no known allergies.   Review of Systems Review of Systems  Constitutional: Negative for chills and fever.  HENT: Negative for ear pain and sore throat.   Eyes: Positive for visual disturbance. Negative for photophobia, pain, discharge and redness.  Respiratory: Negative for cough and shortness of breath.     Cardiovascular: Negative for chest pain and palpitations.  Gastrointestinal: Negative for abdominal pain and vomiting.  Genitourinary: Negative for dysuria and hematuria.  Musculoskeletal: Positive for back pain. Negative for arthralgias.  Skin: Negative for color change and rash.  Neurological: Positive for weakness and numbness. Negative for seizures and syncope.  All other systems reviewed and are negative.    Physical Exam Updated Vital Signs BP 125/72   Pulse 69   Temp 97.9 F (36.6 C) (Oral)   Resp 16   SpO2 100%   Physical Exam Vitals signs and nursing note reviewed.  Constitutional:      Appearance: Normal appearance. He is well-developed. He is not ill-appearing.  HENT:     Head: Normocephalic and atraumatic.  Eyes:     General: Lids are normal. No visual field deficit.    Extraocular Movements: Extraocular movements intact.     Conjunctiva/sclera: Conjunctivae normal.     Left eye: Left conjunctiva is not injected. No chemosis, exudate or hemorrhage.    Pupils: Pupils are equal, round, and reactive to light.     Visual Fields: Left eye visual fields normal.     Comments: Visual acuity OS: 20/30  Neck:     Musculoskeletal: Neck supple.  Cardiovascular:     Rate and Rhythm: Normal rate and regular rhythm.     Pulses:          Radial pulses are 2+ on the right side and 2+ on the left side.     Heart sounds: S1 normal and S2 normal. No murmur.  Pulmonary:     Effort: Pulmonary effort is normal. No respiratory distress.     Breath sounds: Normal breath sounds.  Abdominal:     Palpations: Abdomen is soft.     Tenderness: There is no abdominal tenderness.  Musculoskeletal:     Right shoulder: He exhibits decreased range of motion and tenderness. He exhibits no bony tenderness, no deformity and no spasm.     Right elbow: Normal.    Right wrist: Normal.     Right upper arm: Normal.     Right forearm: Normal.     Right hand: Normal.     Comments: No atrophy of  muscle groups in the right arm or hand.  No tenderness to palpation about the right AC joint or scapula.  Mild tenderness over the right lateral deltoid.  No pain with passive range of motion of the right shoulder until abducted at around 75 degrees.  Significant limitation in abduction in the right shoulder.  Normal in the left shoulder.  Skin:    General: Skin is warm and dry.  Neurological:     General: No focal deficit present.     Mental Status: He is  alert and oriented to person, place, and time.     GCS: GCS eye subscore is 4. GCS verbal subscore is 5. GCS motor subscore is 6.     Cranial Nerves: Cranial nerves are intact. No dysarthria or facial asymmetry.     Sensory: Sensation is intact. No sensory deficit.     Motor: Weakness present.     Coordination: Coordination is intact. Coordination normal. Finger-Nose-Finger Test normal. Rapid alternating movements normal.     Comments: Right arm exhibits normal 5 out of 5 strength to flexion at the shoulder, elbow, wrist and grip in the right hand.  There is a 4 out of 5 strength with extension at the right elbow which patient states is limited secondary to pain in his right shoulder.  Psychiatric:        Behavior: Behavior is cooperative.      ED Treatments / Results  Labs (all labs ordered are listed, but only abnormal results are displayed) Labs Reviewed  I-STAT CHEM 8, ED - Abnormal; Notable for the following components:      Result Value   Glucose, Bld 145 (*)    All other components within normal limits    EKG EKG Interpretation  Date/Time:  Friday November 05 2018 16:31:47 EST Ventricular Rate:  61 PR Interval:    QRS Duration: 103 QT Interval:  401 QTC Calculation: 404 R Axis:   24 Text Interpretation:  Sinus rhythm ST elev, probable normal early repol pattern Baseline wander in lead(s) V2 No STEMI.  Confirmed by Alona Bene [phone]) on 11/05/2018 4:53:45 PM   Radiology Dg Shoulder Right  Result Date:  11/05/2018 CLINICAL DATA:  52 y/o M; right anterior shoulder pain for 3 months. Fall 6 months ago and 3 months ago. EXAM: RIGHT SHOULDER - 2+ VIEW COMPARISON:  None. FINDINGS: There is no evidence of fracture or dislocation. There is no evidence of arthropathy or other focal bone abnormality. Soft tissues are unremarkable. IMPRESSION: Negative. Electronically Signed   By: Mitzi Hansen M.D.   On: 11/05/2018 17:33    Procedures Procedures (including critical care time)  Medications Ordered in ED Medications  ibuprofen (ADVIL,MOTRIN) tablet 600 mg (600 mg Oral Given 11/05/18 1707)     Initial Impression / Assessment and Plan / ED Course  I have reviewed the triage vital signs and the nursing notes.  Pertinent labs & imaging results that were available during my care of the patient were reviewed by me and considered in my medical decision making (see chart for details).      MDM:  Imaging: XR shoulder right is normal  ED Provider Interpretation of EKG: NSR without STE/STD.  No pathologic TWI.   Labs: Chem 8 unremarkable with Glc 145 and Cr 0.7   On initial evaluation, patient appears well. Afebrile and hemodynamically stable. Alert and oriented x4, pleasant, and cooperative. Presents with right shoulder pain, weakness, and left eye changes as above.  On exam, patient has minimal weakness in the right shoulder which is mostly breakaway secondary to pain.  No sensory deficits.  Radial pulses are equal bilaterally with no upper back pain and no chest pain.  Low suspicion for dissection.  EKG shows no evidence for acute ischemia or arrhythmia.  Doubt ACS.  No evidence of pericarditis or myocarditis.  Negative Spurling test bilaterally and overall low suspicion for radiculopathy and brachial plexopathy.  There is no indication for advanced imaging of the C-spine at this time and low suspicion for epidural abscess, transverse  myelitis, or other myelopathy.  No evidence for CVA or  TIA.  He has decreased range of motion of the right shoulder especially with abduction but no pain with passive range of motion until the limits of his abduction.  No erythema or effusion.  Doubt septic arthritis.  No indication for arthrocentesis.  No evidence for carpal tunnel syndrome.  X-ray of the right shoulder is normal.  Regarding his shoulder discomfort suspect chronic rotator cuff tendinopathy versus early adhesive capsulitis versus other impingement/peripheral nerve compression.  He was given p.o. Motrin in the ED after renal function was found to be normal as above.  Regarding his visual changes in the left eye, his acuity appears to be near normal.  He reports that symptoms wax and wane.  There is no evidence for cranial nerve deficit and no evidence for impingement of extraocular muscles.  Overall the patient appears well and there is no evidence for infectious etiology or scleritis, episcleritis, or iritis.  Low suspicion for glaucoma.  No evidence for endophthalmitis or preseptal cellulitis.  No hypopyon or hyphema.  Suspect a component of diabetic retinopathy.  Counseled extensively on the importance of follow-up with his PCP as well as optometry for continued checks.  Additionally, reported intermittent pain in his lumbar spine.  No tenderness on midline palpation.  No perianal or saddle anesthesias or other evidence for cauda equina syndrome including urinary incontinence.  He reports he has chronic constipation but this is unchanged from his baseline.  His pain is not radicular in nature and he is neurovascularly intact distally.  Patient again reportedly has all of his medications at home.  He was given health and wellness follow-up information and discharged in stable condition with return precautions as well as a short course of p.o. Motrin.  Counseled that he may require physical therapy and/or additional imaging studies as an outpatient for his right shoulder discomfort.  The plan  for this patient was discussed with Dr. Jacqulyn Bath who voiced agreement and who oversaw evaluation and treatment of this patient.   The patient was fully informed and involved with the history taking, evaluation, workup including labs/images, and plan. The patient's concerns and questions were addressed to the patient's satisfaction and he expressed agreement with the plan to DC home.    Final Clinical Impressions(s) / ED Diagnoses   Final diagnoses:  Chronic midline low back pain without sciatica  Chronic right shoulder pain  Blurred vision, left eye    ED Discharge Orders         Ordered    ibuprofen (ADVIL,MOTRIN) 400 MG tablet  Every 6 hours PRN     11/05/18 1805           Decarla Siemen, Sherryle Lis, MD 11/05/18 2204    Maia Plan, MD 11/06/18 [phone]

## 2018-11-05 NOTE — ED Notes (Signed)
Pt ambulatory to the room, getting changed into gown awaiting the provider.  A&Ox4 no difficulty breathing at this time.

## 2018-11-05 NOTE — ED Triage Notes (Signed)
Pt states reports mid back and right arm pain for the last 1 week. Pt has strong equal grip but states right arm is hard to move due to pain.

## 2018-11-05 NOTE — ED Notes (Signed)
Patient transported to X-ray 

## 2018-11-05 NOTE — ED Notes (Signed)
Patient Alert and oriented to baseline. Stable and ambulatory to baseline. Patient verbalized understanding of the discharge instructions.  Patient belongings were taken by the patient.   

## 2019-02-02 ENCOUNTER — Ambulatory Visit (HOSPITAL_COMMUNITY)
Admission: EM | Admit: 2019-02-02 | Discharge: 2019-02-02 | Disposition: A | Discharge status: Home / Self Care | Payer: Self-pay | Attending: Family Medicine | Admitting: Family Medicine

## 2019-02-02 ENCOUNTER — Encounter (HOSPITAL_COMMUNITY): Payer: Self-pay | Admitting: Emergency Medicine

## 2019-02-02 ENCOUNTER — Other Ambulatory Visit: Payer: Self-pay

## 2019-02-02 DIAGNOSIS — E119 Type 2 diabetes mellitus without complications: Secondary | ICD-10-CM

## 2019-02-02 DIAGNOSIS — I10 Essential (primary) hypertension: Secondary | ICD-10-CM

## 2019-02-02 DIAGNOSIS — Z760 Encounter for issue of repeat prescription: Secondary | ICD-10-CM

## 2019-02-02 DIAGNOSIS — E7800 Pure hypercholesterolemia, unspecified: Secondary | ICD-10-CM

## 2019-02-02 LAB — GLUCOSE, CAPILLARY: Glucose-Capillary: 115 mg/dL — ABNORMAL HIGH (ref 70–99)

## 2019-02-02 MED ORDER — GLIPIZIDE 5 MG PO TABS
5.0000 mg | ORAL_TABLET | Freq: Two times a day (BID) | ORAL | 0 refills | Status: DC
Start: 2019-02-02 — End: 2019-07-06

## 2019-02-02 MED ORDER — METFORMIN HCL 1000 MG PO TABS: 1000.0000 mg | ORAL_TABLET | Freq: Two times a day (BID) | ORAL | 0 refills | Status: DC

## 2019-02-02 MED ORDER — PRAVASTATIN SODIUM 10 MG PO TABS: 10.0000 mg | ORAL_TABLET | Freq: Every day | ORAL | 0 refills | Status: DC

## 2019-02-02 MED ORDER — PIOGLITAZONE HCL 15 MG PO TABS: 15.0000 mg | ORAL_TABLET | Freq: Every day | ORAL | 1 refills | Status: DC

## 2019-02-02 MED ORDER — LISINOPRIL 20 MG PO TABS: 20.0000 mg | ORAL_TABLET | Freq: Every day | ORAL | 0 refills | Status: DC

## 2019-02-02 NOTE — Discharge Instructions (Signed)
I am refilling your medications.  I am also putting a contact for primary care physician.  Please follow up with them for further med refills.

## 2019-02-02 NOTE — ED Triage Notes (Signed)
Requesting medication refill.  Out of glipizide and actos, and lisinopril and cholesterol

## 2019-02-02 NOTE — ED Provider Notes (Signed)
[facility]    CSN: [id number] Arrival date & time: 02/02/19  1108     History   Chief Complaint Chief Complaint  Patient presents with  . Medication Refill    HPI Chase Mccormick is a 53 y.o. male.   Patient is a 53 year old male with a past medical history of diabetes, high cholesterol, high blood pressure.  He presents today with needing his medications refilled.  Reports that he has not had his medications in approximately 3 weeks.  Otherwise he has been feeling okay.  Denies any chest pain, shortness of breath, palpitations, headache, dizziness, numbness, tingling or weakness.  Reports that he has been eating a healthy diet and exercising. 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   ROS per HPI      Past Medical History:  Diagnosis Date  . Diabetes mellitus   . High cholesterol   . Hypertension     There are no active problems to display for this patient.   History reviewed. No pertinent surgical history.     Home Medications    Prior to Admission medications   Medication Sig Start Date End Date Taking? Authorizing Provider  glipiZIDE (GLUCOTROL) 5 MG tablet Take 1 tablet (5 mg total) by mouth 2 (two) times daily before a meal. 02/02/19   Gunter Conde A, NP  hydrochlorothiazide (HYDRODIURIL) 25 MG tablet Take 0.5 tablets (12.5 mg total) by mouth daily. 08/01/18   Mancel Bale, MD  ibuprofen (ADVIL,MOTRIN) 400 MG tablet Take 1 tablet (400 mg total) by mouth every 6 (six) hours as needed. 11/05/18   Scheidler, Sherryle Lis, MD  lisinopril (PRINIVIL,ZESTRIL) 20 MG tablet Take 1 tablet (20 mg total) by mouth daily. 02/02/19   Dahlia Byes A, NP  metFORMIN (GLUCOPHAGE) 1000 MG tablet Take 1 tablet (1,000 mg total) by mouth 2 (two) times daily with a meal. 02/02/19   Gabriana Wilmott A, NP  naproxen (NAPROSYN) 375 MG tablet Take 1 tablet (375 mg total) by mouth 2 (two) times daily. 04/15/18   Linus Mako B, NP  pioglitazone  (ACTOS) 15 MG tablet Take 1 tablet (15 mg total) by mouth daily. 02/02/19   Dahlia Byes A, NP  pravastatin (PRAVACHOL) 10 MG tablet Take 1 tablet (10 mg total) by mouth daily. 02/02/19   Janace Aris, NP    Family History Family History  Problem Relation Age of Onset  . Ulcers Mother     Social History Social History   Tobacco Use  . Smoking status: Never Smoker  . Smokeless tobacco: Never Used  Substance Use Topics  . Alcohol use: No    Comment: stopped 3 years ago  . Drug use: No     Allergies   Patient has no known allergies.   Review of Systems Review of Systems   Physical Exam Triage Vital Signs ED Triage Vitals  Enc Vitals Group     BP 02/02/19 1142 (!) 153/88     Pulse Rate 02/02/19 1142 (!) 52     Resp 02/02/19 1142 20     Temp 02/02/19 1142 98.1 F (36.7 C)     Temp Source 02/02/19 1142 Oral     SpO2 02/02/19 1142 (!) 9 %     Weight --      Height --      Head Circumference --      Peak Flow --      Pain Score 02/02/19 1140 0  Pain Loc --      Pain Edu? --      Excl. in GC? --    No data found.  Updated Vital Signs BP (!) 153/88 (BP Location: Left Arm) Comment: has not had medication  Pulse (!) 52   Temp 98.1 F (36.7 C) (Oral)   Resp 20   SpO2 (!) 9%   Visual Acuity Right Eye Distance:   Left Eye Distance:   Bilateral Distance:    Right Eye Near:   Left Eye Near:    Bilateral Near:     Physical Exam Vitals signs and nursing note reviewed.  Constitutional:      General: He is not in acute distress.    Appearance: Normal appearance. He is not ill-appearing, toxic-appearing or diaphoretic.  HENT:     Head: Normocephalic and atraumatic.     Nose: Nose normal.     Mouth/Throat:     Pharynx: Oropharynx is clear.  Eyes:     Conjunctiva/sclera: Conjunctivae normal.  Neck:     Musculoskeletal: Normal range of motion.  Cardiovascular:     Rate and Rhythm: Normal rate and regular rhythm.  Pulmonary:     Effort: Pulmonary effort  is normal.     Breath sounds: Normal breath sounds.  Musculoskeletal: Normal range of motion.  Skin:    General: Skin is warm and dry.  Neurological:     Mental Status: He is alert.  Psychiatric:        Mood and Affect: Mood normal.      UC Treatments / Results  Labs (all labs ordered are listed, but only abnormal results are displayed) Labs Reviewed  GLUCOSE, CAPILLARY - Abnormal; Notable for the following components:      Result Value   Glucose-Capillary 115 (*)    All other components within normal limits  CBG MONITORING, ED    EKG None  Radiology No results found.  Procedures Procedures (including critical care time)  Medications Ordered in UC Medications - No data to display  Initial Impression / Assessment and Plan / UC Course  I have reviewed the triage vital signs and the nursing notes.  Pertinent labs & imaging results that were available during my care of the patient were reviewed by me and considered in my medical decision making (see chart for details).     Medication refill  Patient exam normal.  No concerning signs or symptoms. Blood pressure slightly elevated at 153/88 and blood sugar was 115 We will go ahead and refill medications Patient had recent kidney function checked back in January that was normal [facility]  Follow up as needed for continued or worsening symptoms   Final Clinical Impressions(s) / UC Diagnoses   Final diagnoses:  Medication refill     Discharge Instructions     I am refilling your medications.  I am also putting a contact for primary care physician.  Please follow up with them for further med refills.     ED Prescriptions    Medication Sig Dispense Auth. Provider   glipiZIDE (GLUCOTROL) 5 MG tablet Take 1 tablet (5 mg total) by mouth 2 (two) times daily before a meal. 180 tablet Westen Dinino A, NP   lisinopril (PRINIVIL,ZESTRIL) 20 MG tablet Take 1 tablet (20 mg  total) by mouth daily. 90 tablet Nyliah Nierenberg A, NP   metFORMIN (GLUCOPHAGE) 1000 MG tablet Take 1 tablet (1,000 mg total) by mouth 2 (two) times daily  with a meal. 180 tablet Maelani Yarbro A, NP   pioglitazone (ACTOS) 15 MG tablet Take 1 tablet (15 mg total) by mouth daily. 90 tablet Jameire Kouba A, NP   pravastatin (PRAVACHOL) 10 MG tablet Take 1 tablet (10 mg total) by mouth daily. 90 tablet Dahlia Byes A, NP     Controlled Substance Prescriptions [place] Controlled Substance Registry consulted? Not Applicable   Janace Aris, NP 02/02/19 1258

## 2019-07-06 ENCOUNTER — Encounter (HOSPITAL_COMMUNITY): Payer: Self-pay

## 2019-07-06 ENCOUNTER — Ambulatory Visit (HOSPITAL_COMMUNITY)
Admission: EM | Admit: 2019-07-06 | Discharge: 2019-07-06 | Disposition: A | Discharge status: Home / Self Care | Payer: Self-pay | Attending: Family Medicine | Admitting: Family Medicine

## 2019-07-06 ENCOUNTER — Other Ambulatory Visit: Payer: Self-pay

## 2019-07-06 DIAGNOSIS — M79601 Pain in right arm: Secondary | ICD-10-CM

## 2019-07-06 DIAGNOSIS — E1165 Type 2 diabetes mellitus with hyperglycemia: Secondary | ICD-10-CM

## 2019-07-06 DIAGNOSIS — I10 Essential (primary) hypertension: Secondary | ICD-10-CM

## 2019-07-06 MED ORDER — METFORMIN HCL 1000 MG PO TABS: 1000.0000 mg | ORAL_TABLET | Freq: Two times a day (BID) | ORAL | 3 refills | Status: DC

## 2019-07-06 MED ORDER — PIOGLITAZONE HCL 15 MG PO TABS: 15.0000 mg | ORAL_TABLET | Freq: Every day | ORAL | 3 refills | Status: DC

## 2019-07-06 MED ORDER — GLIPIZIDE 5 MG PO TABS: 5.0000 mg | ORAL_TABLET | Freq: Two times a day (BID) | ORAL | 3 refills | Status: DC

## 2019-07-06 MED ORDER — NAPROXEN 375 MG PO TABS: 375.0000 mg | ORAL_TABLET | Freq: Two times a day (BID) | ORAL | 1 refills | Status: DC

## 2019-07-06 MED ORDER — LISINOPRIL 20 MG PO TABS: 20.0000 mg | ORAL_TABLET | Freq: Every day | ORAL | 3 refills | Status: DC

## 2019-07-06 NOTE — ED Triage Notes (Signed)
Patient presents to Urgent Care with complaints of needing glipizide, actos, metformin, and lisinopril refilled since running out within the past week. Patient reports he lost his job and insurance and so does not have a PCP at this time. Pt also would like his chronic right arm pain assessed and to receive meds for it, states his arm hurts worse in the morning and this has been ongoing for over a year.

## 2019-07-06 NOTE — ED Provider Notes (Signed)
Chase Mccormick   [id number] 07/06/19 Arrival Time: 7829  ASSESSMENT & PLAN:  1. Type 2 diabetes mellitus with hyperglycemia, without long-term current use of insulin ([place])   2. Uncontrolled hypertension   3. Right arm pain     Meds ordered this encounter  Medications  . glipiZIDE (GLUCOTROL) 5 MG tablet    Sig: Take 1 tablet (5 mg total) by mouth 2 (two) times daily before a meal.    Dispense:  60 tablet    Refill:  3  . lisinopril (ZESTRIL) 20 MG tablet    Sig: Take 1 tablet (20 mg total) by mouth daily.    Dispense:  30 tablet    Refill:  3  . metFORMIN (GLUCOPHAGE) 1000 MG tablet    Sig: Take 1 tablet (1,000 mg total) by mouth 2 (two) times daily with a meal.    Dispense:  60 tablet    Refill:  3  . pioglitazone (ACTOS) 15 MG tablet    Sig: Take 1 tablet (15 mg total) by mouth daily.    Dispense:  30 tablet    Refill:  3  . naproxen (NAPROSYN) 375 MG tablet    Sig: Take 1 tablet (375 mg total) by mouth 2 (two) times daily.    Dispense:  20 tablet    Refill:  1    Follow-up Information    Schedule an appointment as soon as possible for a visit  with Hunter.   Why: To discuss your shoulder pain. Contact information: [address] [place] [phone]          Reviewed expectations re: course of current medical issues. Questions answered. Outlined signs and symptoms indicating need for more acute intervention. Patient verbalized understanding. After Visit Summary given.   SUBJECTIVE:  Chase Mccormick is a 53 y.o. male who presents with concerns regarding increased blood pressures. He reports running out of his medications. Feels BP "has been a little high recently".  He reports taking medications as instructed, no medication side effects noted, no TIA's, no chest pain on exertion, no dyspnea on exertion and no swelling of ankles.  Also needs refill of metformin. Blood sugars  usually controlled but these have been high over the past week also. No visual changes or urinary frequency. No recent illnesses. Afebrile. Normal PO intake without n/v.  Also complains of chronic right shoulder discomfort after falling one year ago. Has been seen by a medical provider and given a medicine "that helped while I was taking it but then my shoulder pain returned". No extremity sensation changes or weakness. Feels more discomfort and stiffness in the morning. Pain does not limit his daily activities.  Social History   Tobacco Use  Smoking Status Never Smoker  Smokeless Tobacco Never Used   ROS: As per HPI. All other systems negative.    OBJECTIVE:  Vitals:   07/06/19 1810  BP: (!) 152/90  Pulse: 60  Resp: 16  Temp: 98.7 F (37.1 C)  TempSrc: Temporal  SpO2: 100%    General appearance: alert; no distress Eyes: PERRLA; EOMI HENT: normocephalic; atraumatic Neck: supple Lungs: clear to auscultation bilaterally Heart: regular rate and rhythm without murmer Extremities: no edema; symmetrical with no gross deformities; does reports vague TTP over posterior R shoulder but has FROM with mild reported discomfort; RUE with normal distal sensation and strength and normal capillary refill Skin: warm and dry Psychological: alert and cooperative; normal mood and  affect  No Known Allergies  Past Medical History:  Diagnosis Date  . Diabetes mellitus   . High cholesterol   . Hypertension    Social History   Socioeconomic History  . Marital status: Married    Spouse name: Not on file  . Number of children: Not on file  . Years of education: Not on file  . Highest education level: Not on file  Occupational History  . Not on file  Social Needs  . Financial resource strain: Not on file  . Food insecurity    Worry: Not on file    Inability: Not on file  . Transportation needs    Medical: Not on file    Non-medical: Not on file  Tobacco Use  . Smoking status: Never  Smoker  . Smokeless tobacco: Never Used  Substance and Sexual Activity  . Alcohol use: No    Comment: stopped 3 years ago  . Drug use: No  . Sexual activity: Not on file  Lifestyle  . Physical activity    Days per week: Not on file    Minutes per session: Not on file  . Stress: Not on file  Relationships  . Social Musicianconnections    Talks on phone: Not on file    Gets together: Not on file    Attends religious service: Not on file    Active member of club or organization: Not on file    Attends meetings of clubs or organizations: Not on file    Relationship status: Not on file  . Intimate partner violence    Fear of current or ex partner: Not on file    Emotionally abused: Not on file    Physically abused: Not on file    Forced sexual activity: Not on file  Other Topics Concern  . Not on file  Social History Narrative  . Not on file   Family History  Problem Relation Age of Onset  . Ulcers Mother    History reviewed. No pertinent surgical history.    Mardella LaymanHagler, Debanhi Blaker, MD 07/06/19 1840

## 2019-10-20 ENCOUNTER — Ambulatory Visit (HOSPITAL_COMMUNITY)
Admission: EM | Admit: 2019-10-20 | Discharge: 2019-10-20 | Disposition: A | Discharge status: Home / Self Care | Payer: Self-pay | Attending: Family Medicine | Admitting: Family Medicine

## 2019-10-20 ENCOUNTER — Encounter (HOSPITAL_COMMUNITY): Payer: Self-pay

## 2019-10-20 ENCOUNTER — Other Ambulatory Visit: Payer: Self-pay

## 2019-10-20 DIAGNOSIS — E119 Type 2 diabetes mellitus without complications: Secondary | ICD-10-CM

## 2019-10-20 DIAGNOSIS — H6503 Acute serous otitis media, bilateral: Secondary | ICD-10-CM

## 2019-10-20 LAB — CBG MONITORING, ED
Glucose-Capillary: 307 mg/dL — ABNORMAL HIGH (ref 70–99)
Glucose-Capillary: 307 mg/dL — ABNORMAL HIGH (ref 70–99)

## 2019-10-20 LAB — GLUCOSE, CAPILLARY: Glucose-Capillary: 307 mg/dL — ABNORMAL HIGH (ref 70–99)

## 2019-10-20 MED ORDER — AMOXICILLIN 875 MG PO TABS: 875.0000 mg | ORAL_TABLET | Freq: Two times a day (BID) | ORAL | 0 refills | Status: DC

## 2019-10-20 MED ORDER — TRIAMCINOLONE ACETONIDE 55 MCG/ACT NA AERO: 2.0000 | INHALATION_SPRAY | Freq: Every day | NASAL | 0 refills | Status: AC

## 2019-10-20 NOTE — ED Triage Notes (Signed)
Pt states he has right ear discomfort for 2 weeks but started getting worst yesterday.

## 2019-10-20 NOTE — ED Provider Notes (Signed)
[facility]    CSN: [id number] Arrival date & time: 10/20/19  1345      History   Chief Complaint Chief Complaint  Patient presents with  . Otalgia    HPI Chase Mccormick is a 53 y.o. male.   HPI  Patient is here for bilateral ear pain.  Is been getting worse for 2 weeks.  Right ear started first theN left ear.  No runny nose, sinus congestion, sore throat.  No fever or chills.  No hearing loss. Patient is diabetic.  He ate cake today.  He feels like his sugar might be high.  Asked to have his sugar checked Past Medical History:  Diagnosis Date  . Diabetes mellitus   . High cholesterol   . Hypertension     There are no problems to display for this patient.   History reviewed. No pertinent surgical history.     Home Medications    Prior to Admission medications   Medication Sig Start Date End Date Taking? Authorizing Provider  amoxicillin (AMOXIL) 875 MG tablet Take 1 tablet (875 mg total) by mouth 2 (two) times daily. 10/20/19   Eustace Moore, MD  glipiZIDE (GLUCOTROL) 5 MG tablet Take 1 tablet (5 mg total) by mouth 2 (two) times daily before a meal. 07/06/19   Mardella Layman, MD  hydrochlorothiazide (HYDRODIURIL) 25 MG tablet Take 0.5 tablets (12.5 mg total) by mouth daily. 08/01/18   Mancel Bale, MD  ibuprofen (ADVIL,MOTRIN) 400 MG tablet Take 1 tablet (400 mg total) by mouth every 6 (six) hours as needed. 11/05/18   Scheidler, Sherryle Lis, MD  lisinopril (ZESTRIL) 20 MG tablet Take 1 tablet (20 mg total) by mouth daily. 07/06/19   Mardella Layman, MD  metFORMIN (GLUCOPHAGE) 1000 MG tablet Take 1 tablet (1,000 mg total) by mouth 2 (two) times daily with a meal. 07/06/19   Mardella Layman, MD  naproxen (NAPROSYN) 375 MG tablet Take 1 tablet (375 mg total) by mouth 2 (two) times daily. 07/06/19   Mardella Layman, MD  pioglitazone (ACTOS) 15 MG tablet Take 1 tablet (15 mg total) by mouth daily. 07/06/19   Mardella Layman, MD  pravastatin (PRAVACHOL) 10 MG  tablet Take 1 tablet (10 mg total) by mouth daily. 02/02/19   Dahlia Byes A, NP  triamcinolone (NASACORT) 55 MCG/ACT AERO nasal inhaler Place 2 sprays into the nose daily. 10/20/19   Eustace Moore, MD    Family History Family History  Problem Relation Age of Onset  . Ulcers Mother     Social History Social History   Tobacco Use  . Smoking status: Never Smoker  . Smokeless tobacco: Never Used  Substance Use Topics  . Alcohol use: No    Comment: stopped 3 years ago  . Drug use: No     Allergies   Patient has no known allergies.   Review of Systems Review of Systems  Constitutional: Negative for chills and fever.  HENT: Positive for ear pain. Negative for congestion and hearing loss.   Eyes: Negative for pain.  Respiratory: Negative for cough and shortness of breath.   Cardiovascular: Negative for chest pain and leg swelling.  Gastrointestinal: Negative for abdominal pain, constipation and diarrhea.  Genitourinary: Negative for dysuria and frequency.  Musculoskeletal: Negative for myalgias.  Neurological: Negative for dizziness, seizures and headaches.  Psychiatric/Behavioral: The patient is not nervous/anxious.      Physical Exam Triage Vital Signs ED Triage Vitals  Enc Vitals Group  BP 10/20/19 1430 120/68     Pulse Rate 10/20/19 1430 66     Resp 10/20/19 1430 18     Temp 10/20/19 1430 98.6 F (37 C)     Temp Source 10/20/19 1430 Oral     SpO2 10/20/19 1430 99 %     Weight 10/20/19 1429 255 lb (115.7 kg)     Height --      Head Circumference --      Peak Flow --      Pain Score 10/20/19 1429 6     Pain Loc --      Pain Edu? --      Excl. in [place]? --    No data found.  Updated Vital Signs BP 120/68 (BP Location: Right Arm)   Pulse 66   Temp 98.6 F (37 C) (Oral)   Resp 18   Wt 115.7 kg   SpO2 99%   BMI 31.04 kg/m      Physical Exam Constitutional:      General: He is not in acute distress.    Appearance: He is well-developed.  HENT:       Head: Normocephalic and atraumatic.     Right Ear: Ear canal and external ear normal. There is no impacted cerumen.     Left Ear: Ear canal and external ear normal. There is no impacted cerumen.     Ears:     Comments: Both TMs are dull.  Left TM is injected    Mouth/Throat:     Mouth: Mucous membranes are moist.  Eyes:     Conjunctiva/sclera: Conjunctivae normal.     Pupils: Pupils are equal, round, and reactive to light.  Cardiovascular:     Rate and Rhythm: Normal rate and regular rhythm.     Heart sounds: Normal heart sounds.  Pulmonary:     Effort: Pulmonary effort is normal. No respiratory distress.     Breath sounds: Normal breath sounds.  Abdominal:     General: There is no distension.     Palpations: Abdomen is soft.  Musculoskeletal:        General: Normal range of motion.     Cervical back: Normal range of motion.  Lymphadenopathy:     Cervical: No cervical adenopathy.  Skin:    General: Skin is warm and dry.  Neurological:     Mental Status: He is alert.  Psychiatric:        Mood and Affect: Mood normal.        Behavior: Behavior normal.      UC Treatments / Results  Labs (all labs ordered are listed, but only abnormal results are displayed) Labs Reviewed  GLUCOSE, CAPILLARY - Abnormal; Notable for the following components:      Result Value   Glucose-Capillary 307 (*)    All other components within normal limits  CBG MONITORING, ED - Abnormal; Notable for the following components:   Glucose-Capillary 307 (*)    All other components within normal limits  CBG MONITORING, ED - Abnormal; Notable for the following components:   Glucose-Capillary 307 (*)    All other components within normal limits    EKG   Radiology No results found.  Procedures Procedures (including critical care time)  Medications Ordered in UC Medications - No data to display  Initial Impression / Assessment and Plan / UC Course  I have reviewed the triage vital signs  and the nursing notes.  Pertinent labs & imaging results that were available  during my care of the patient were reviewed by me and considered in my medical decision making (see chart for details).      Final Clinical Impressions(s) / UC Diagnoses   Final diagnoses:  Bilateral acute serous otitis media, recurrence not specified     Discharge Instructions     Use the nasal spray daily until your ear pain resolves Take antibiotic 2 times a day for 7 days Your blood sugar is 307 It is important that you follow your diabetic diet and take your medication daily   ED Prescriptions    Medication Sig Dispense Auth. Provider   amoxicillin (AMOXIL) 875 MG tablet Take 1 tablet (875 mg total) by mouth 2 (two) times daily. 14 tablet Raylene Everts, MD   triamcinolone (NASACORT) 55 MCG/ACT AERO nasal inhaler Place 2 sprays into the nose daily. 1 Inhaler Raylene Everts, MD     PDMP not reviewed this encounter.   Raylene Everts, MD 10/20/19 [phone]

## 2019-10-20 NOTE — Discharge Instructions (Addendum)
Use the nasal spray daily until your ear pain resolves Take antibiotic 2 times a day for 7 days Your blood sugar is 307 It is important that you follow your diabetic diet and take your medication daily

## 2020-03-03 ENCOUNTER — Other Ambulatory Visit: Payer: Self-pay

## 2020-03-03 ENCOUNTER — Ambulatory Visit (HOSPITAL_COMMUNITY)
Admission: EM | Admit: 2020-03-03 | Discharge: 2020-03-03 | Disposition: A | Discharge status: Home / Self Care | Payer: Self-pay | Attending: Internal Medicine | Admitting: Internal Medicine

## 2020-03-03 ENCOUNTER — Encounter (HOSPITAL_COMMUNITY): Payer: Self-pay | Admitting: *Deleted

## 2020-03-03 DIAGNOSIS — B353 Tinea pedis: Secondary | ICD-10-CM

## 2020-03-03 DIAGNOSIS — I10 Essential (primary) hypertension: Secondary | ICD-10-CM

## 2020-03-03 DIAGNOSIS — Q159 Congenital malformation of eye, unspecified: Secondary | ICD-10-CM

## 2020-03-03 DIAGNOSIS — E119 Type 2 diabetes mellitus without complications: Secondary | ICD-10-CM

## 2020-03-03 DIAGNOSIS — Z760 Encounter for issue of repeat prescription: Secondary | ICD-10-CM

## 2020-03-03 LAB — CBC
HCT: 42.8 % (ref 39.0–52.0)
Hemoglobin: 13.8 g/dL (ref 13.0–17.0)
MCH: 28.7 pg (ref 26.0–34.0)
MCHC: 32.2 g/dL (ref 30.0–36.0)
MCV: 89 fL (ref 80.0–100.0)
Platelets: 240 10*3/uL (ref 150–400)
RBC: 4.81 MIL/uL (ref 4.22–5.81)
RDW: 12.4 % (ref 11.5–15.5)
WBC: 4.8 10*3/uL (ref 4.0–10.5)
nRBC: 0 % (ref 0.0–0.2)

## 2020-03-03 LAB — COMPREHENSIVE METABOLIC PANEL
ALT: 22 U/L (ref 0–44)
AST: 20 U/L (ref 15–41)
Albumin: 3.9 g/dL (ref 3.5–5.0)
Alkaline Phosphatase: 61 U/L (ref 38–126)
Anion gap: 11 (ref 5–15)
BUN: 16 mg/dL (ref 6–20)
CO2: 26 mmol/L (ref 22–32)
Calcium: 9.4 mg/dL (ref 8.9–10.3)
Chloride: 101 mmol/L (ref 98–111)
Creatinine, Ser: 0.75 mg/dL (ref 0.61–1.24)
GFR calc Af Amer: >60 mL/min (ref >60)
GFR calc non Af Amer: >60 mL/min (ref >60)
Glucose, Bld: 191 mg/dL — ABNORMAL HIGH (ref 70–99)
Potassium: 4.4 mmol/L (ref 3.5–5.1)
Sodium: 138 mmol/L (ref 135–145)
Total Bilirubin: 0.5 mg/dL (ref 0.3–1.2)
Total Protein: 7.1 g/dL (ref 6.5–8.1)

## 2020-03-03 LAB — HEMOGLOBIN A1C
Hgb A1c MFr Bld: 8.8 % — ABNORMAL HIGH (ref 4.8–5.6)
Mean Plasma Glucose: 205.86 mg/dL

## 2020-03-03 LAB — CBG MONITORING, ED: Glucose-Capillary: 176 mg/dL — ABNORMAL HIGH (ref 70–99)

## 2020-03-03 MED ORDER — PIOGLITAZONE HCL 15 MG PO TABS: 15.0000 mg | ORAL_TABLET | Freq: Every day | ORAL | 0 refills | Status: DC

## 2020-03-03 MED ORDER — LISINOPRIL 20 MG PO TABS: 20.0000 mg | ORAL_TABLET | Freq: Every day | ORAL | 0 refills | Status: DC

## 2020-03-03 MED ORDER — GLIPIZIDE 5 MG PO TABS: 5.0000 mg | ORAL_TABLET | Freq: Two times a day (BID) | ORAL | 0 refills | Status: DC

## 2020-03-03 MED ORDER — METFORMIN HCL 1000 MG PO TABS: 1000.0000 mg | ORAL_TABLET | Freq: Two times a day (BID) | ORAL | 0 refills | Status: DC

## 2020-03-03 NOTE — ED Triage Notes (Signed)
C/O intermittent left eye "tingling" x 1 month.  Denies vision changes.  Also requesting refill for pioglitzone, lisinopril, metformin, and glipizide.  Does not have PCP.

## 2020-03-03 NOTE — Discharge Instructions (Addendum)
Avoid eating breads, pasta, white rice, juices, soda's. Eat lean meat like chicken breast or and white fish( tilapia and wild caught salmon only is best) For fruits, eat any kinds of berries, the other fruits are too sweet, but it is better than cookies or candy For salad dressing use extra virgin olive oil and little salt For your toe fungus get over the counter fungus cream called LOTRIMIN and apply twice a day for 10 days.   Get a glucometer from [employer] brand which is cheap and check your sugar 4 times per week   Eat more vegetables like brocoli, coliflower, peas, carrots, lettuce, celery, cucumbers, purple onions, green peppers

## 2020-03-03 NOTE — ED Provider Notes (Signed)
[facility]    CSN: [id number] Arrival date & time: 03/03/20  1238      History   Chief Complaint Chief Complaint  Patient presents with  . Medication Refill  . Eye Tingling    HPI Denzal Ghrist is a 54 y.o. male. who presents for 1- L eye has been pulpitating off and on x 1 month, but denies pain or vision problems. 2- needs refills on his chronic meds. States he does not have a PCP since he does not have health insurance.     Past Medical History:  Diagnosis Date  . Diabetes mellitus   . High cholesterol   . Hypertension     There are no problems to display for this patient.   History reviewed. No pertinent surgical history.     Home Medications    Prior to Admission medications   Medication Sig Start Date End Date Taking? Authorizing Provider  glipiZIDE (GLUCOTROL) 5 MG tablet Take 1 tablet (5 mg total) by mouth 2 (two) times daily before a meal. 07/06/19  Yes Hagler, Arlys John, MD  lisinopril (ZESTRIL) 20 MG tablet Take 1 tablet (20 mg total) by mouth daily. 07/06/19  Yes Mardella Layman, MD  metFORMIN (GLUCOPHAGE) 1000 MG tablet Take 1 tablet (1,000 mg total) by mouth 2 (two) times daily with a meal. 07/06/19  Yes Hagler, Arlys John, MD  pioglitazone (ACTOS) 15 MG tablet Take 1 tablet (15 mg total) by mouth daily. 07/06/19  Yes Mardella Layman, MD  amoxicillin (AMOXIL) 875 MG tablet Take 1 tablet (875 mg total) by mouth 2 (two) times daily. 10/20/19   Eustace Moore, MD  hydrochlorothiazide (HYDRODIURIL) 25 MG tablet Take 0.5 tablets (12.5 mg total) by mouth daily. 08/01/18   Mancel Bale, MD  ibuprofen (ADVIL,MOTRIN) 400 MG tablet Take 1 tablet (400 mg total) by mouth every 6 (six) hours as needed. 11/05/18   Scheidler, Sherryle Lis, MD  naproxen (NAPROSYN) 375 MG tablet Take 1 tablet (375 mg total) by mouth 2 (two) times daily. 07/06/19   Mardella Layman, MD  pravastatin (PRAVACHOL) 10 MG tablet Take 1 tablet (10 mg total) by mouth daily. 02/02/19   Dahlia Byes A, NP  triamcinolone (NASACORT) 55 MCG/ACT AERO nasal inhaler Place 2 sprays into the nose daily. 10/20/19   Eustace Moore, MD    Family History Family History  Problem Relation Age of Onset  . Ulcers Mother     Social History Social History   Tobacco Use  . Smoking status: Never Smoker  . Smokeless tobacco: Never Used  Substance Use Topics  . Alcohol use: No    Comment: stopped 3 years ago  . Drug use: No     Allergies   Patient has no known allergies.   Review of Systems Review of Systems Review of Systems  Constitutional: Negative for diaphoresis and unexpected weight change.  HENT: Negative for tinnitus.   Eyes: Negative for visual disturbance.  Respiratory: Negative for chest tightness and shortness of breath.   Cardiovascular: Negative for chest pain, palpitations and leg swelling.  Gastrointestinal: Negative for constipation, diarrhea and nausea.  Endocrine: Negative for polydipsia, polyphagia and polyuria.  Genitourinary: Negative for dysuria and frequency.  Skin: Negative for rash and wound.  Neurological: Negative for dizziness, speech difficulty, weakness, numbness and headaches.    Physical Exam Triage Vital Signs ED Triage Vitals  Enc Vitals Group     BP 03/03/20 1337 (!) 158/96     Pulse Rate 03/03/20 1337  67     Resp 03/03/20 1337 16     Temp 03/03/20 1337 98.4 F (36.9 C)     Temp Source 03/03/20 1337 Oral     SpO2 03/03/20 1337 99 %     Weight --      Height --      Head Circumference --      Peak Flow --      Pain Score 03/03/20 1351 0     Pain Loc --      Pain Edu? --      Excl. in [place]? --    No data found.  Updated Vital Signs BP (!) 158/96 (BP Location: Left Arm)   Pulse 67   Temp 98.4 F (36.9 C) (Oral)   Resp 16   SpO2 99%   Visual Acuity Right Eye Distance:   Left Eye Distance:   Bilateral Distance:    Right Eye Near:   Left Eye Near:    Bilateral Near:     Physical Exam  Constitutional: She is  oriented to person, place, and time. She appears well-developed and well-nourished. No distress.  HENT:  Eyes- L PRRLA, I could not do the fundi exam due to having difficulty seeing through pupil. Lids and conjunctiva is normal Head: Normocephalic and atraumatic.  Right Ear: External ear normal.  Left Ear: External ear normal.  Nose: Nose normal.  Eyes: Conjunctivae are normal. Right eye exhibits no discharge. Left eye exhibits no discharge. No scleral icterus.  Neck: Neck supple. No thyromegaly present.  No carotid bruits bilaterally  Cardiovascular: Normal rate and regular rhythm.  No murmur heard. Pulmonary/Chest: Effort normal and breath sounds normal. No respiratory distress.  Musculoskeletal: Normal range of motion. She exhibits no edema.  Lymphadenopathy:    She has no cervical adenopathy.  Neurological: She is alert and oriented to person, place, and time.  Skin: Skin is warm and dry. Capillary refill takes less than 2 seconds. No rash noted. She is not diaphoretic. Has normal feet sensation. Has maceration between L small toe and 4th toe. No wounds.  Psychiatric: She has a normal mood and affect. Her behavior is normal. Judgment and thought content normal.  Nursing note reviewed.   UC Treatments / Results  Labs (all labs ordered are listed, but only abnormal results are displayed) Labs Reviewed  COMPREHENSIVE METABOLIC PANEL  CBC  CBG MONITORING, ED  CBC abd CMP nl except for elevated glucose. HGBA1C 8.8 1 h 45 min post prandial glucose 179.  EKG   Radiology No results found.  Procedures Procedures (including critical care time)  Medications Ordered in UC Medications - No data to display  Initial Impression / Assessment and Plan / UC Course  I have reviewed the triage vital signs and the nursing notes. Pertinent labs results that were available during my care of the patient were reviewed by me and considered in my medical decision making (see chart for  details). I spends 50% of the visit educating him on Dm diet. See instructions I encouraged him to get established with a PCP for follow up and needs to see eye MD. I ordered CMP, CBC, HGBA1C and I informed him of the results.  Final Clinical Impressions(s) / UC Diagnoses   Final diagnoses:  None   Discharge Instructions   None    ED Prescriptions    None     PDMP not reviewed this encounter.   Shelby Mattocks, Hershal Coria 03/03/20 1858

## 2020-05-23 ENCOUNTER — Other Ambulatory Visit: Payer: Self-pay

## 2020-05-23 ENCOUNTER — Encounter (HOSPITAL_COMMUNITY): Payer: Self-pay

## 2020-05-23 ENCOUNTER — Ambulatory Visit (HOSPITAL_COMMUNITY)
Admission: EM | Admit: 2020-05-23 | Discharge: 2020-05-23 | Disposition: A | Discharge status: Home / Self Care | Payer: Self-pay | Attending: Family Medicine | Admitting: Family Medicine

## 2020-05-23 DIAGNOSIS — M25571 Pain in right ankle and joints of right foot: Secondary | ICD-10-CM

## 2020-05-23 DIAGNOSIS — R609 Edema, unspecified: Secondary | ICD-10-CM

## 2020-05-23 DIAGNOSIS — M25572 Pain in left ankle and joints of left foot: Secondary | ICD-10-CM

## 2020-05-23 DIAGNOSIS — E119 Type 2 diabetes mellitus without complications: Secondary | ICD-10-CM

## 2020-05-23 DIAGNOSIS — M7989 Other specified soft tissue disorders: Secondary | ICD-10-CM

## 2020-05-23 DIAGNOSIS — Z760 Encounter for issue of repeat prescription: Secondary | ICD-10-CM

## 2020-05-23 LAB — CBG MONITORING, ED: Glucose-Capillary: 144 mg/dL — ABNORMAL HIGH (ref 70–99)

## 2020-05-23 MED ORDER — PIOGLITAZONE HCL 15 MG PO TABS: 15.0000 mg | ORAL_TABLET | Freq: Every day | ORAL | 0 refills | Status: DC

## 2020-05-23 MED ORDER — LISINOPRIL 20 MG PO TABS: 20.0000 mg | ORAL_TABLET | Freq: Every day | ORAL | 0 refills | Status: DC

## 2020-05-23 MED ORDER — METFORMIN HCL 1000 MG PO TABS: 1000.0000 mg | ORAL_TABLET | Freq: Two times a day (BID) | ORAL | 0 refills | Status: DC

## 2020-05-23 MED ORDER — PRAVASTATIN SODIUM 10 MG PO TABS: 10.0000 mg | ORAL_TABLET | Freq: Every day | ORAL | 0 refills | Status: DC

## 2020-05-23 MED ORDER — GLIPIZIDE 5 MG PO TABS: 5.0000 mg | ORAL_TABLET | Freq: Two times a day (BID) | ORAL | 0 refills | Status: DC

## 2020-05-23 NOTE — Discharge Instructions (Signed)
Please establish care with a primary care provider for long term management of your medications

## 2020-05-23 NOTE — ED Notes (Signed)
Patient was outside when being called.  Walked in to be seen.

## 2020-05-23 NOTE — ED Notes (Signed)
Patient called x 2 with no response. 

## 2020-05-23 NOTE — ED Triage Notes (Signed)
Pt presents to UC for leg and foot swelling. Pt states he sleeps in his car with his feet dangling and believes swelling is related.   Pt also requesting refill of "all" medications.

## 2020-09-16 ENCOUNTER — Other Ambulatory Visit: Payer: Self-pay

## 2020-09-16 ENCOUNTER — Emergency Department (HOSPITAL_COMMUNITY)
Admission: EM | Admit: 2020-09-16 | Discharge: 2020-09-16 | Disposition: A | Discharge status: Home / Self Care | Payer: Self-pay | Attending: Emergency Medicine | Admitting: Emergency Medicine

## 2020-09-16 ENCOUNTER — Encounter (HOSPITAL_COMMUNITY): Payer: Self-pay | Admitting: Emergency Medicine

## 2020-09-16 DIAGNOSIS — E7800 Pure hypercholesterolemia, unspecified: Secondary | ICD-10-CM | POA: Insufficient documentation

## 2020-09-16 DIAGNOSIS — E1165 Type 2 diabetes mellitus with hyperglycemia: Secondary | ICD-10-CM | POA: Insufficient documentation

## 2020-09-16 DIAGNOSIS — H53142 Visual discomfort, left eye: Secondary | ICD-10-CM | POA: Insufficient documentation

## 2020-09-16 DIAGNOSIS — I10 Essential (primary) hypertension: Secondary | ICD-10-CM | POA: Insufficient documentation

## 2020-09-16 DIAGNOSIS — H5712 Ocular pain, left eye: Secondary | ICD-10-CM

## 2020-09-16 DIAGNOSIS — Z79899 Other long term (current) drug therapy: Secondary | ICD-10-CM | POA: Insufficient documentation

## 2020-09-16 DIAGNOSIS — H5789 Other specified disorders of eye and adnexa: Secondary | ICD-10-CM

## 2020-09-16 LAB — CBG MONITORING, ED
Glucose-Capillary: 142 mg/dL — ABNORMAL HIGH (ref 70–99)
Glucose-Capillary: 144 mg/dL — ABNORMAL HIGH (ref 70–99)

## 2020-09-16 MED ORDER — FLUORESCEIN SODIUM 1 MG OP STRP
1.0000 | ORAL_STRIP | Freq: Once | OPHTHALMIC | Status: AC
  Administered 2020-09-16: 1 via OPHTHALMIC
  Filled 2020-09-16: qty 1

## 2020-09-16 MED ORDER — PIOGLITAZONE HCL 15 MG PO TABS: 15.0000 mg | ORAL_TABLET | Freq: Every day | ORAL | 0 refills | Status: DC

## 2020-09-16 MED ORDER — LISINOPRIL 20 MG PO TABS: 20.0000 mg | ORAL_TABLET | Freq: Every day | ORAL | 0 refills | Status: DC

## 2020-09-16 MED ORDER — METFORMIN HCL 1000 MG PO TABS: 1000.0000 mg | ORAL_TABLET | Freq: Two times a day (BID) | ORAL | 0 refills | Status: DC

## 2020-09-16 MED ORDER — TETRACAINE HCL 0.5 % OP SOLN
2.0000 [drp] | Freq: Once | OPHTHALMIC | Status: AC
  Administered 2020-09-16: 2 [drp] via OPHTHALMIC
  Filled 2020-09-16: qty 4

## 2020-09-16 MED ORDER — GLIPIZIDE 5 MG PO TABS: 5.0000 mg | ORAL_TABLET | Freq: Two times a day (BID) | ORAL | 0 refills | Status: DC

## 2020-09-16 MED ORDER — PRAVASTATIN SODIUM 10 MG PO TABS: 10.0000 mg | ORAL_TABLET | Freq: Every day | ORAL | 0 refills | Status: DC

## 2020-09-16 NOTE — ED Notes (Signed)
Wanda Case Manager consulted on pt's behalf. Pt needs PCP to prescribe DM medications and for other follow up care.

## 2020-09-16 NOTE — Progress Notes (Signed)
09/16/20 1100  TOC ED Mini Assessment  TOC Time spent with patient (minutes): 30  PING Used in TOC Assessment No  Admission or Readmission Diverted Yes  Interventions which prevented an admission or readmission Medication Review;PCP Appointment Scheduled  What brought you to the Emergency Department?  visual disturbance  Barriers to Discharge ED No Barriers  Barrier interventions Connect with [facility] and assisted with maintence medications  Means of departure Car  ED CM met with patient at  bedside. He reports being out of medications for over a month and not having a PCP. Discussed the [facility] and services rendered patient is agreeable with establishing care at the clinic. EDP will refill prescription, patient does not have health insurance and states he will need assistance with affording his meds.   Patient is eligible for MATCH medication assistance, explained the guideline including the $3 co-pay per prescription patient can afford the co-pay. Provided information for the clinic via pamphlet and placed on AVS.   EDP will send Prescriptions patient's Pharmacy ([facility] [address]) CM will also send information to the clinic to f/u with the DM Navigator.  Updated EDP , no further ED CM needs identified

## 2020-09-16 NOTE — ED Notes (Signed)
BGL 142.

## 2020-09-16 NOTE — ED Triage Notes (Signed)
C/o L eye being itchy and red x 2 weeks.  Denies pain.  Requesting CBG be checked.

## 2020-09-16 NOTE — Discharge Instructions (Signed)
Please go to Dr. Meda Coffee office first thing tomorrow morning (8 AM) for further evaluation of your left eye. It is recommended that you pick up Artificial Tears OTC eye drops today and to use until you see him.   I have re-prescribed your medications for you however you will need to establish care with a primary care provider for further refills. Please call [facility] tomorrow after 9 AM to schedule an appointment.   Return to the ED for any worsening symptoms

## 2020-09-16 NOTE — ED Provider Notes (Signed)
[facility] EMERGENCY DEPARTMENT Provider Note   CSN: [id number] Arrival date & time: 09/16/20  7062     History Chief Complaint  Patient presents with  . Eye Problem    Chase Mccormick is a 54 y.o. male with PMHx HTN, high cholesterol, and diabetes who presents to the ED today with complaint of gradual onset, constant, left eye redness x 2 weeks. Pt also complains of consistent itching to the left eye as well as an intermittent "stabbing" pain to the eye itself. Pt states he can no longer read the newspaper however when asked if he is having blurry vision or double vision he denies. He states he is concerned because he has a friend who has diabetes as well as lost his vision. He states he attempted to call an ophthalmologist in the area however was told he needs a referral and decided to come to the ED instead. 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 He has not tried anything at home for his symptoms.   He is also requesting refills of his medications. States that he used to see Dr. Mikeal Hawthorne as a patient however due to lack of insurance he no longer has a PCP. Pt states he has been going to urgent cares in the area to get refills. Per chart review the last time his medications were prescribed were in August for 1 month without refills.   The history is provided by medical records and the patient.       Past Medical History:  Diagnosis Date  . Diabetes mellitus   . High cholesterol   . Hypertension     There are no problems to display for this patient.   History reviewed. No pertinent surgical history.     Family History  Problem Relation Age of Onset  . Ulcers Mother     Social History   Tobacco Use  . Smoking status: Never Smoker  . Smokeless tobacco: Never Used  Vaping Use  . Vaping Use: Some days  Substance Use Topics  . Alcohol use: No    Comment: stopped 3 years ago  . Drug use: No    Home Medications Prior to Admission medications    Medication Sig Start Date End Date Taking? Authorizing Provider  glipiZIDE (GLUCOTROL) 5 MG tablet Take 1 tablet (5 mg total) by mouth 2 (two) times daily before a meal. 09/16/20   Amariz Flamenco, PA-C  ibuprofen (ADVIL,MOTRIN) 400 MG tablet Take 1 tablet (400 mg total) by mouth every 6 (six) hours as needed. 11/05/18   Scheidler, Sherryle Lis, MD  lisinopril (ZESTRIL) 20 MG tablet Take 1 tablet (20 mg total) by mouth daily. 09/16/20   Tanda Rockers, PA-C  metFORMIN (GLUCOPHAGE) 1000 MG tablet Take 1 tablet (1,000 mg total) by mouth 2 (two) times daily with a meal. 09/16/20   Maahir Horst, PA-C  naproxen (NAPROSYN) 375 MG tablet Take 1 tablet (375 mg total) by mouth 2 (two) times daily. 07/06/19   Mardella Layman, MD  pioglitazone (ACTOS) 15 MG tablet Take 1 tablet (15 mg total) by mouth daily. 09/16/20   Hyman Hopes, Granvil Djordjevic, PA-C  pravastatin (PRAVACHOL) 10 MG tablet Take 1 tablet (10 mg total) by mouth daily. 09/16/20   Hyman Hopes, Lejuan Botto, PA-C  triamcinolone (NASACORT) 55 MCG/ACT AERO nasal inhaler Place 2 sprays into the nose daily. 10/20/19   Eustace Moore, MD  hydrochlorothiazide (HYDRODIURIL) 25 MG tablet Take 0.5 tablets (12.5 mg total) by mouth daily. 08/01/18 03/03/20  Mancel Bale, MD    Allergies    Patient has no known allergies.  Review of Systems   Review of Systems  Constitutional: Negative for chills and fever.  Eyes: Positive for pain, redness and itching. Negative for discharge.    Physical Exam Updated Vital Signs BP (!) 141/87 (BP Location: Right Arm)   Pulse (!) 54   Temp (!) 97.2 F (36.2 C) (Oral)   Resp 15   Ht 6\' 4"  (1.93 m)   Wt 106.1 kg   SpO2 100%   BMI 28.48 kg/m   Physical Exam Vitals and nursing note reviewed.  Constitutional:      Appearance: He is not ill-appearing.  HENT:     Head: Normocephalic and atraumatic.  Eyes:     Intraocular pressure: Left eye pressure is 27 mmHg.     Conjunctiva/sclera:     Right eye: Right conjunctiva is  injected.     Left eye: Left conjunctiva is injected.     Pupils:     Left eye: No corneal abrasion or fluorescein uptake.     Comments: Bilateral conjunctival injection however pt only complains of left eye being red. 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 PERRL. No trauma appreciated to the eye including hyphema. No FB appreciated.   Cardiovascular:     Rate and Rhythm: Normal rate and regular rhythm.     Pulses: Normal pulses.  Pulmonary:     Effort: Pulmonary effort is normal.     Breath sounds: Normal breath sounds. No wheezing, rhonchi or rales.  Skin:    General: Skin is warm and dry.     Coloration: Skin is not jaundiced.  Neurological:     Mental Status: He is alert.     ED Results / Procedures / Treatments   Labs (all labs ordered are listed, but only abnormal results are displayed) Labs Reviewed  CBG MONITORING, ED - Abnormal; Notable for the following components:      Result Value   Glucose-Capillary 144 (*)    All other components within normal limits  CBG MONITORING, ED - Abnormal; Notable for the following components:   Glucose-Capillary 142 (*)    All other components within normal limits    EKG None  Radiology No results found.  Procedures Procedures (including critical care time)  Medications Ordered in ED Medications  fluorescein ophthalmic strip 1 strip (1 strip Left Eye Given by Other 09/16/20 0926)  tetracaine (PONTOCAINE) 0.5 % ophthalmic solution 2 drop (2 drops Left Eye Given by Other 09/16/20 09/18/20)    ED Course  I have reviewed the triage vital signs and the nursing notes.  Pertinent labs & imaging results that were available during my care of the patient were reviewed by me and considered in my medical decision making (see chart for details).    MDM Rules/Calculators/A&P                          54 year old male with a history of diabetes who presents to the ED today complaining of more specifically left eye redness, itching for the  past 2 weeks with associated intermittent eye pain that he reports as stabbing. He states he is concerned because he has a friend who has diabetes as well and lost his vision. He does report that he is unable to read the newspaper however when asked if he has blurry vision or double vision patient states no. He is a poor  historian. He is also requesting medication refills of all of his medications. He does not have a PCP and has been going to urgent care to get refills. He states he recently ran out however per chart review he has not had refills since August of this year. On arrival to the ED vitals are stable. Patient is afebrile, nontachycardic nontachypneic. On exam he has bilateral conjunctival injection. He is legally blind in the right eye. His left eye pupil is equal round reactive to light. He denies any trauma to the eye. No foreign body sensation however he states intermittent stabbing pain in his eye. He does not have any pain currently however states that 2 hours ago he did have pain. He attempted to call an ophthalmologist office however they told him he needs a referral and so he came to the ED instead. Visual acuity obtained in the left eye 20/20. Is no fluorescein uptake on exam today. Extraocular pressure measured initially at 27 and then again at 33. Unfortunately our slit-lamp is not cooperating at this time and therefore I consulted with Dr. Haynes Dage with ophthalmology. He states he will see patient in the office tomorrow further evaluation. He recommends artificial tears at this time. He suspect that patient may need reading glasses per his complaints at this time. The setting of patient needing medications and a PCP transition of care has been consulted.   TOC has evaluated pt - able to get patient an appointment at [facility] however won't be for another 2 weeks and will need refills here today. Will provide.   Final Clinical Impression(s) / ED Diagnoses Final diagnoses:  Left eye pain  Eye  irritation  Type 2 diabetes mellitus with hyperglycemia, unspecified whether long term insulin use (HCC)  Primary hypertension  High cholesterol    Rx / DC Orders ED Discharge Orders         Ordered    glipiZIDE (GLUCOTROL) 5 MG tablet  2 times daily before meals        09/16/20 1054    lisinopril (ZESTRIL) 20 MG tablet  Daily        09/16/20 1054    metFORMIN (GLUCOPHAGE) 1000 MG tablet  2 times daily with meals        09/16/20 1054    pioglitazone (ACTOS) 15 MG tablet  Daily        09/16/20 1054    pravastatin (PRAVACHOL) 10 MG tablet  Daily        09/16/20 1054           Discharge Instructions     Please go to Dr. Meda Coffee office first thing tomorrow morning (8 AM) for further evaluation of your left eye. It is recommended that you pick up Artificial Tears OTC eye drops today and to use until you see him.   I have re-prescribed your medications for you however you will need to establish care with a primary care provider for further refills. Please call [facility] tomorrow after 9 AM to schedule an appointment.   Return to the ED for any worsening symptoms       Tanda Rockers, PA-C 09/16/20 1057    Jacalyn Lefevre, MD 09/16/20 1109

## 2020-09-16 NOTE — Care Management (Signed)
  [facility] Medication Assistance Card Name: Davaughn Hillyard (MRN): [id number] Bin: [id number] RX Group: [id number] Discharge Date:09/16/20  Expiration Date:  09/28/20                                           (must be filled within 7 days of discharge)        Dear   : Ezequiel Kayser     You have been approved to have the prescriptions written by your discharging physician filled through our [facility] (Medication Assistance Through [facility]) program. This program allows for a one-time (no refills) 34-day supply of selected medications for a low copay amount.  The copay is $3.00 per prescription. For instance, if you have one prescription, you will pay $3.00; for two prescriptions, you pay $6.00; for three prescriptions, you pay $9.00; and so on.  Only certain pharmacies are participating in this program with [facility]. You will need to select one of the pharmacies from the attached list and take your prescriptions, this letter, and your photo ID to one of the participating pharmacies.   We are excited that you are able to use the [facility] program to get your medications. These prescriptions must be filled within 7 days of hospital discharge or they will no longer be valid for the [facility] program. Should you have any problems with your prescriptions please contact your case management team member at [phone] for [place].  Thank you, [facility] Care Management

## 2020-11-27 ENCOUNTER — Ambulatory Visit (HOSPITAL_COMMUNITY)
Admission: EM | Admit: 2020-11-27 | Discharge: 2020-11-27 | Disposition: A | Discharge status: Home / Self Care | Payer: Self-pay | Attending: Family Medicine | Admitting: Family Medicine

## 2020-11-27 ENCOUNTER — Encounter (HOSPITAL_COMMUNITY): Payer: Self-pay

## 2020-11-27 ENCOUNTER — Ambulatory Visit (INDEPENDENT_AMBULATORY_CARE_PROVIDER_SITE_OTHER): Payer: Self-pay

## 2020-11-27 ENCOUNTER — Other Ambulatory Visit: Payer: Self-pay

## 2020-11-27 DIAGNOSIS — M25522 Pain in left elbow: Secondary | ICD-10-CM

## 2020-11-27 DIAGNOSIS — S5002XA Contusion of left elbow, initial encounter: Secondary | ICD-10-CM

## 2020-11-27 MED ORDER — DICLOFENAC SODIUM 75 MG PO TBEC
75.0000 mg | DELAYED_RELEASE_TABLET | Freq: Two times a day (BID) | ORAL | 0 refills | Status: DC
Start: 2020-11-27 — End: 2021-07-10

## 2020-11-27 NOTE — ED Provider Notes (Signed)
[facility] CARE CENTER   [id number] 11/27/20 Arrival Time: 1413  ASSESSMENT & PLAN:  1. Contusion of left elbow, initial encounter     I have personally viewed the imaging studies ordered this visit. No elbow fracture appreciated.  Begin: Meds ordered this encounter  Medications  . diclofenac (VOLTAREN) 75 MG EC tablet    Sig: Take 1 tablet (75 mg total) by mouth 2 (two) times daily.    Dispense:  14 tablet    Refill:  0   Encourage ROM as tolerated; activities as tolerated.  Recommend:  Follow-up Information    [facility].   Why: If worsening or failing to improve as anticipated. Contact information: [address] [place] [address] [phone]              Reviewed expectations re: course of current medical issues. Questions answered. Outlined signs and symptoms indicating need for more acute intervention. Patient verbalized understanding. After Visit Summary given.  SUBJECTIVE: History from: patient. Samson Dileonardo is a 55 y.o. male who reports fairly persistent moderate pain of his left elbow; described as aching; without radiation. Onset: abrupt. First noted: a week ago. Injury/trama: reports being hit in the elbow with a broom handle that broke upon impact. Symptoms have progressed to a point and plateaued since beginning. Aggravating factors: certain movements. Alleviating factors: rest. Associated symptoms: none reported. Extremity sensation changes or weakness: none. Self treatment: has not tried OTC therapies.  History of similar: no.  History reviewed. No pertinent surgical history.    OBJECTIVE:  Vitals:   11/27/20 1453  BP: (!) 141/84  Pulse: 67  Resp: 18  Temp: 98.1 F (36.7 C)  TempSrc: Oral  SpO2: 100%    General appearance: alert; no distress HEENT: [place]; AT Neck: supple with FROM Resp: unlabored respirations Extremities: . LUE: warm with well perfused appearance;  poorly localized moderate tenderness over left lateral elbow; without gross deformities; swelling: moderate; bruising: none; elbow ROM: limited by reported pain CV: brisk extremity capillary refill of LUE; 2+ radial pulse of LUE. Skin: warm and dry; no visible rashes Neurologic: gait normal; normal sensation and strength of LUE Psychological: alert and cooperative; normal mood and affect  Imaging: DG Elbow Complete Left  Result Date: 11/27/2020 CLINICAL DATA:  Left elbow pain for 6 days, trauma, swelling EXAM: LEFT ELBOW - COMPLETE 3+ VIEW COMPARISON:  10/08/2016 FINDINGS: Frontal, bilateral oblique, and lateral views of the left elbow are obtained. No fracture, subluxation, or dislocation. Joint spaces are well preserved. No joint effusion. Soft tissues are normal. IMPRESSION: 1. Unremarkable left elbow. Electronically Signed   By: Sharlet Salina M.D.   On: 11/27/2020 16:01      No Known Allergies  Past Medical History:  Diagnosis Date  . Diabetes mellitus   . High cholesterol   . Hypertension    Social History   Socioeconomic History  . Marital status: Married    Spouse name: Not on file  . Number of children: Not on file  . Years of education: Not on file  . Highest education level: Not on file  Occupational History  . Not on file  Tobacco Use  . Smoking status: Never Smoker  . Smokeless tobacco: Never Used  Vaping Use  . Vaping Use: Some days  Substance and Sexual Activity  . Alcohol use: No    Comment: stopped 3 years ago  . Drug use: No  . Sexual activity: Not on file  Other  Topics Concern  . Not on file  Social History Narrative  . Not on file   Social Determinants of Health   Financial Resource Strain: Not on file  Food Insecurity: Not on file  Transportation Needs: Not on file  Physical Activity: Not on file  Stress: Not on file  Social Connections: Not on file   Family History  Problem Relation Age of Onset  . Ulcers Mother    History reviewed. No  pertinent surgical history.    Mardella Layman, MD 11/27/20 [phone]

## 2020-11-27 NOTE — ED Triage Notes (Signed)
Pt c/o left arm pain 2/2 being struck by a large push broom last Wednesday. Pt states a co-worker struck him across his arm with the full length of the stick/broom; breaking the long handle in two. Pt c/o pain from mid-bicep area to mid forearm and has difficulty lifting arm, flexing/extending elbow and performing ROM.  Fingers warm to touch, brisk cap refill +2 radial and ulnar pulses. Has not taken any pain relievers.

## 2021-04-20 ENCOUNTER — Other Ambulatory Visit: Payer: Self-pay

## 2021-04-20 ENCOUNTER — Ambulatory Visit (HOSPITAL_COMMUNITY)
Admission: EM | Admit: 2021-04-20 | Discharge: 2021-04-20 | Disposition: A | Discharge status: Home / Self Care | Payer: Self-pay | Attending: Family Medicine | Admitting: Family Medicine

## 2021-04-20 ENCOUNTER — Encounter (HOSPITAL_COMMUNITY): Payer: Self-pay | Admitting: *Deleted

## 2021-04-20 DIAGNOSIS — M79675 Pain in left toe(s): Secondary | ICD-10-CM

## 2021-04-20 MED ORDER — NAPROXEN 500 MG PO TABS
500.0000 mg | ORAL_TABLET | Freq: Two times a day (BID) | ORAL | 0 refills | Status: DC | PRN
Start: 2021-04-20 — End: 2024-08-18
  Filled 2021-04-20: qty 30, 15d supply, fill #0

## 2021-04-20 NOTE — Discharge Instructions (Addendum)
Make sure to stretch well before and after physical activity.  You may ice the area, soak the foot in Epsom salts, massage the area additionally.  I have sent over anti-inflammatory pain medication to take as needed.  Follow-up with podiatry as listed below if your symptoms worsen or do not improve.  [facility] Address: [address] [place], [place], [place] [address] Phone: [phone]

## 2021-04-20 NOTE — ED Triage Notes (Signed)
Pt reports Pain in his Rt great toe .

## 2021-04-21 NOTE — ED Provider Notes (Signed)
[facility]    CSN: [id number] Arrival date & time: 04/20/21  1658      History   Chief Complaint Chief Complaint  Patient presents with   Toe Pain    HPI Chase Mccormick is a 55 y.o. male.   Patient presenting today with over a year of left great toe pain radiating up into dorsal left foot that only happens with extensive walking.  He states it does not happen every day, usually only when he is walking multiple miles at a time such as for work.  He denies any direct injury to the area, numbness, tingling, swelling, discoloration.  Able to move all 5 toes and ambulate without difficulty otherwise.  So far has not tried anything over-the-counter for symptoms.  No past history of orthopedic issues.   Past Medical History:  Diagnosis Date   Diabetes mellitus    High cholesterol    Hypertension     There are no problems to display for this patient.   History reviewed. No pertinent surgical history.   Home Medications    Prior to Admission medications   Medication Sig Start Date End Date Taking? Authorizing Provider  naproxen (NAPROSYN) 500 MG tablet Take 1 tablet (500 mg total) by mouth 2 (two) times daily as needed. 04/20/21  Yes Particia Nearing, PA-C  diclofenac (VOLTAREN) 75 MG EC tablet Take 1 tablet (75 mg total) by mouth 2 (two) times daily. 11/27/20   Mardella Layman, MD  glipiZIDE (GLUCOTROL) 5 MG tablet Take 1 tablet (5 mg total) by mouth 2 (two) times daily before a meal. 09/16/20   Venter, Margaux, PA-C  ibuprofen (ADVIL,MOTRIN) 400 MG tablet Take 1 tablet (400 mg total) by mouth every 6 (six) hours as needed. 11/05/18   Scheidler, Sherryle Lis, MD  lisinopril (ZESTRIL) 20 MG tablet Take 1 tablet (20 mg total) by mouth daily. 09/16/20   Tanda Rockers, PA-C  metFORMIN (GLUCOPHAGE) 1000 MG tablet Take 1 tablet (1,000 mg total) by mouth 2 (two) times daily with a meal. 09/16/20   Venter, Margaux, PA-C  pioglitazone (ACTOS) 15 MG tablet Take 1 tablet  (15 mg total) by mouth daily. 09/16/20   Hyman Hopes, Margaux, PA-C  pravastatin (PRAVACHOL) 10 MG tablet Take 1 tablet (10 mg total) by mouth daily. 09/16/20   Hyman Hopes, Margaux, PA-C  triamcinolone (NASACORT) 55 MCG/ACT AERO nasal inhaler Place 2 sprays into the nose daily. 10/20/19   Eustace Moore, MD  hydrochlorothiazide (HYDRODIURIL) 25 MG tablet Take 0.5 tablets (12.5 mg total) by mouth daily. 08/01/18 03/03/20  Mancel Bale, MD    Family History Family History  Problem Relation Age of Onset   Ulcers Mother     Social History Social History   Tobacco Use   Smoking status: Never   Smokeless tobacco: Never  Vaping Use   Vaping Use: Some days  Substance Use Topics   Alcohol use: No    Comment: stopped 3 years ago   Drug use: No    Allergies   Patient has no known allergies.   Review of Systems Review of Systems Per HPI  Physical Exam Triage Vital Signs ED Triage Vitals  Enc Vitals Group     BP 04/20/21 1735 131/65     Pulse Rate 04/20/21 1735 67     Resp 04/20/21 1735 18     Temp 04/20/21 1735 98.5 F (36.9 C)     Temp src --      SpO2 04/20/21 1735 97 %  Weight --      Height --      Head Circumference --      Peak Flow --      Pain Score 04/20/21 1738 10     Pain Loc --      Pain Edu? --      Excl. in GC? --    No data found.  Updated Vital Signs BP 131/65   Pulse 67   Temp 98.5 F (36.9 C)   Resp 18   SpO2 97%   Visual Acuity Right Eye Distance:   Left Eye Distance:   Bilateral Distance:    Right Eye Near:   Left Eye Near:    Bilateral Near:     Physical Exam Vitals and nursing note reviewed.  Constitutional:      Appearance: Normal appearance.  HENT:     Head: Atraumatic.     Mouth/Throat:     Mouth: Mucous membranes are moist.     Pharynx: Oropharynx is clear.  Eyes:     Extraocular Movements: Extraocular movements intact.     Conjunctiva/sclera: Conjunctivae normal.  Cardiovascular:     Rate and Rhythm: Normal rate and  regular rhythm.  Pulmonary:     Effort: Pulmonary effort is normal.     Breath sounds: Normal breath sounds.  Musculoskeletal:        General: No swelling, tenderness, deformity or signs of injury. Normal range of motion.     Cervical back: Normal range of motion and neck supple.     Comments: Entire left foot nontender to palpation or ambulation, patient endorses that the pain is not currently present.  Skin:    General: Skin is warm and dry.     Findings: No bruising or erythema.  Neurological:     General: No focal deficit present.     Mental Status: He is oriented to person, place, and time.  Psychiatric:        Mood and Affect: Mood normal.        Thought Content: Thought content normal.        Judgment: Judgment normal.   UC Treatments / Results  Labs (all labs ordered are listed, but only abnormal results are displayed) Labs Reviewed - No data to display  EKG   Radiology No results found.  Procedures Procedures (including critical care time)  Medications Ordered in UC Medications - No data to display  Initial Impression / Assessment and Plan / UC Course  I have reviewed the triage vital signs and the nursing notes.  Pertinent labs & imaging results that were available during my care of the patient were reviewed by me and considered in my medical decision making (see chart for details).     Given no traumatic injury to the area, no deformity palpable on exam, intermittent nature and chronicity of the pain will defer imaging as very low suspicion for a bony injury today.  Suspect a tendinitis/overuse injury from extensive walking, possibly with unsupportive shoes contributing to the issue.  Will provide naproxen for as needed use and discussed getting orthotic shoes with inserts if needed, Epsom salt soaks, good stretches before and after long periods of walking or exercise and podiatry follow-up if not fully resolving.  Patient is agreeable to plan.  Return for  acutely worsening symptoms. Final Clinical Impressions(s) / UC Diagnoses   Final diagnoses:  Great toe pain, left     Discharge Instructions      Make sure to stretch  well before and after physical activity.  You may ice the area, soak the foot in Epsom salts, massage the area additionally.  I have sent over anti-inflammatory pain medication to take as needed.  Follow-up with podiatry as listed below if your symptoms worsen or do not improve.  [facility] Address: [address] [place], [place], [place] [address] Phone: [phone]     ED Prescriptions     Medication Sig Dispense Auth. Provider   naproxen (NAPROSYN) 500 MG tablet Take 1 tablet (500 mg total) by mouth 2 (two) times daily as needed. 30 tablet Particia Nearing, [place]      PDMP not reviewed this encounter.   Particia Nearing, [place] 04/21/21 1021

## 2021-04-23 ENCOUNTER — Other Ambulatory Visit (HOSPITAL_BASED_OUTPATIENT_CLINIC_OR_DEPARTMENT_OTHER): Payer: Self-pay

## 2021-05-07 ENCOUNTER — Other Ambulatory Visit (HOSPITAL_BASED_OUTPATIENT_CLINIC_OR_DEPARTMENT_OTHER): Payer: Self-pay

## 2021-05-23 ENCOUNTER — Other Ambulatory Visit: Payer: Self-pay

## 2021-05-23 ENCOUNTER — Encounter (HOSPITAL_COMMUNITY): Payer: Self-pay

## 2021-05-23 ENCOUNTER — Ambulatory Visit (HOSPITAL_COMMUNITY)
Admission: EM | Admit: 2021-05-23 | Discharge: 2021-05-23 | Disposition: A | Discharge status: Home / Self Care | Payer: Self-pay | Attending: Internal Medicine | Admitting: Internal Medicine

## 2021-05-23 DIAGNOSIS — N3944 Nocturnal enuresis: Secondary | ICD-10-CM

## 2021-05-23 LAB — BASIC METABOLIC PANEL
Anion gap: 7 (ref 5–15)
BUN: 12 mg/dL (ref 6–20)
CO2: 26 mmol/L (ref 22–32)
Calcium: 9.3 mg/dL (ref 8.9–10.3)
Chloride: 106 mmol/L (ref 98–111)
Creatinine, Ser: 0.77 mg/dL (ref 0.61–1.24)
GFR, Estimated: >60 mL/min (ref >60)
Glucose, Bld: 199 mg/dL — ABNORMAL HIGH (ref 70–99)
Potassium: 4.3 mmol/L (ref 3.5–5.1)
Sodium: 139 mmol/L (ref 135–145)

## 2021-05-23 LAB — POCT URINALYSIS DIPSTICK, ED / UC
Bilirubin Urine: NEGATIVE
Glucose, UA: >=1000 mg/dL — AB
Hgb urine dipstick: NEGATIVE
Leukocytes,Ua: NEGATIVE
Nitrite: NEGATIVE
Protein, ur: NEGATIVE mg/dL
Specific Gravity, Urine: 1.02 (ref 1.005–1.030)
Urobilinogen, UA: 1 mg/dL (ref 0.0–1.0)
pH: 6 (ref 5.0–8.0)

## 2021-05-23 LAB — PSA: Prostatic Specific Antigen: 0.33 ng/mL (ref 0.00–4.00)

## 2021-05-23 LAB — HEMOGLOBIN A1C
Hgb A1c MFr Bld: 9.6 % — ABNORMAL HIGH (ref 4.8–5.6)
Mean Plasma Glucose: 228.82 mg/dL

## 2021-05-23 NOTE — ED Provider Notes (Signed)
[facility]    CSN: [id number] Arrival date & time: 05/23/21  1212      History   Chief Complaint Chief Complaint  Patient presents with   urinary issues    HPI Chase Mccormick is a 55 y.o. male with history of hypertension, diabetes mellitus type 2 and hypercholesterolemia comes to the urgent care complaining of nocturnal enuresis which started 2 weeks ago.  Patient denies any dysuria, urgency or frequency.  He has no awareness of this happening while this is happening.  No abdominal pain.  He endorses some straining on micturition and postvoid dribbling.  No abdominal pain or distention.  No fever or chills.  No increased thirst.  Patient denies any urinary incontinence during the day.  No weight changes. HPI  Past Medical History:  Diagnosis Date   Diabetes mellitus    High cholesterol    Hypertension     There are no problems to display for this patient.   History reviewed. No pertinent surgical history.     Home Medications    Prior to Admission medications   Medication Sig Start Date End Date Taking? Authorizing Provider  glipiZIDE (GLUCOTROL) 5 MG tablet Take 1 tablet (5 mg total) by mouth 2 (two) times daily before a meal. 09/16/20  Yes Venter, Margaux, PA-C  lisinopril (ZESTRIL) 20 MG tablet Take 1 tablet (20 mg total) by mouth daily. 09/16/20  Yes Venter, Margaux, PA-C  metFORMIN (GLUCOPHAGE) 1000 MG tablet Take 1 tablet (1,000 mg total) by mouth 2 (two) times daily with a meal. 09/16/20  Yes Venter, Margaux, PA-C  pravastatin (PRAVACHOL) 10 MG tablet Take 1 tablet (10 mg total) by mouth daily. 09/16/20  Yes Venter, Margaux, PA-C  diclofenac (VOLTAREN) 75 MG EC tablet Take 1 tablet (75 mg total) by mouth 2 (two) times daily. 11/27/20   Mardella Layman, MD  ibuprofen (ADVIL,MOTRIN) 400 MG tablet Take 1 tablet (400 mg total) by mouth every 6 (six) hours as needed. 11/05/18   Scheidler, Sherryle Lis, MD  naproxen (NAPROSYN) 500 MG tablet Take 1 tablet (500  mg total) by mouth 2 (two) times daily as needed. 04/20/21   Particia Nearing, PA-C  pioglitazone (ACTOS) 15 MG tablet Take 1 tablet (15 mg total) by mouth daily. 09/16/20   Hyman Hopes, Margaux, PA-C  triamcinolone (NASACORT) 55 MCG/ACT AERO nasal inhaler Place 2 sprays into the nose daily. 10/20/19   Eustace Moore, MD  hydrochlorothiazide (HYDRODIURIL) 25 MG tablet Take 0.5 tablets (12.5 mg total) by mouth daily. 08/01/18 03/03/20  Mancel Bale, MD    Family History Family History  Problem Relation Age of Onset   Ulcers Mother     Social History Social History   Tobacco Use   Smoking status: Some Days    Types: Cigarettes   Smokeless tobacco: Never  Vaping Use   Vaping Use: Never used  Substance Use Topics   Alcohol use: No    Comment: stopped 3 years ago   Drug use: No     Allergies   Patient has no known allergies.   Review of Systems Review of Systems  Constitutional: Negative.   Respiratory: Negative.    Gastrointestinal: Negative.   Genitourinary:  Positive for enuresis. Negative for dysuria, hematuria, penile discharge, penile pain and urgency.  Musculoskeletal: Negative.     Physical Exam Triage Vital Signs ED Triage Vitals  Enc Vitals Group     BP 05/23/21 1341 (!) 148/86     Pulse Rate 05/23/21 1341  60     Resp 05/23/21 1341 16     Temp 05/23/21 1341 98.1 F (36.7 C)     Temp Source 05/23/21 1341 Oral     SpO2 05/23/21 1341 97 %     Weight --      Height --      Head Circumference --      Peak Flow --      Pain Score 05/23/21 1343 0     Pain Loc --      Pain Edu? --      Excl. in GC? --    No data found.  Updated Vital Signs BP (!) 148/86   Pulse 60   Temp 98.1 F (36.7 C) (Oral)   Resp 16   SpO2 97%   Visual Acuity Right Eye Distance:   Left Eye Distance:   Bilateral Distance:    Right Eye Near:   Left Eye Near:    Bilateral Near:     Physical Exam Vitals and nursing note reviewed.  Constitutional:      General: He is  not in acute distress.    Appearance: He is not ill-appearing.  Cardiovascular:     Rate and Rhythm: Normal rate and regular rhythm.     Pulses: Normal pulses.     Heart sounds: Normal heart sounds.  Pulmonary:     Effort: Pulmonary effort is normal.     Breath sounds: Normal breath sounds.  Abdominal:     General: Bowel sounds are normal.     Palpations: Abdomen is soft.  Musculoskeletal:        General: Normal range of motion.  Neurological:     Mental Status: He is alert.     UC Treatments / Results  Labs (all labs ordered are listed, but only abnormal results are displayed) Labs Reviewed  BASIC METABOLIC PANEL  HEMOGLOBIN A1C  PSA  POCT URINALYSIS DIPSTICK, ED / UC    EKG   Radiology No results found.  Procedures Procedures (including critical care time)  Medications Ordered in UC Medications - No data to display  Initial Impression / Assessment and Plan / UC Course  I have reviewed the triage vital signs and the nursing notes.  Pertinent labs & imaging results that were available during my care of the patient were reviewed by me and considered in my medical decision making (see chart for details).     1.  Nocturnal enuresis: Point-of-care urinalysis BMP, PSA Patient is advised to avoid drinking fluids close to bedtime He will benefit from sitting on the long to void once in the night If his symptoms persist he is advised to go to urologist for further evaluation. Final Clinical Impressions(s) / UC Diagnoses   Final diagnoses:  Enuresis, nocturnal only     Discharge Instructions      Avoid drinking a lot of water close your bedtime Make sure you urinate before going to bed Set an alarm clock to wake up at least once in the night to void. We will call you with recommendations if labs are abnormal If your symptoms persist will recommend you follow-up with a urologist for further evaluation.    ED Prescriptions   None    PDMP not reviewed  this encounter.   Merrilee Jansky, MD 05/23/21 [phone]

## 2021-05-23 NOTE — Discharge Instructions (Addendum)
Avoid drinking a lot of water close your bedtime Make sure you urinate before going to bed Set an alarm clock to wake up at least once in the night to void. We will call you with recommendations if labs are abnormal If your symptoms persist will recommend you follow-up with a urologist for further evaluation.

## 2021-05-23 NOTE — ED Triage Notes (Addendum)
Pt c/o "bed wetting" two times this week. Pt denies abd and lower back pain. Pt states he is a type 2 diabetic and sometimes has fevers at night.

## 2021-07-10 ENCOUNTER — Other Ambulatory Visit: Payer: Self-pay

## 2021-07-10 ENCOUNTER — Ambulatory Visit (HOSPITAL_COMMUNITY)
Admission: EM | Admit: 2021-07-10 | Discharge: 2021-07-10 | Disposition: A | Discharge status: Home / Self Care | Payer: Self-pay | Attending: Family Medicine | Admitting: Family Medicine

## 2021-07-10 ENCOUNTER — Encounter (HOSPITAL_COMMUNITY): Payer: Self-pay

## 2021-07-10 DIAGNOSIS — S39012A Strain of muscle, fascia and tendon of lower back, initial encounter: Secondary | ICD-10-CM

## 2021-07-10 DIAGNOSIS — S161XXA Strain of muscle, fascia and tendon at neck level, initial encounter: Secondary | ICD-10-CM

## 2021-07-10 MED ORDER — CYCLOBENZAPRINE HCL 10 MG PO TABS: ORAL_TABLET | ORAL | 0 refills | Status: DC

## 2021-07-10 MED ORDER — DICLOFENAC SODIUM 75 MG PO TBEC: 75.0000 mg | DELAYED_RELEASE_TABLET | Freq: Two times a day (BID) | ORAL | 0 refills | Status: AC
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
## 2022-01-30 ENCOUNTER — Encounter (HOSPITAL_BASED_OUTPATIENT_CLINIC_OR_DEPARTMENT_OTHER): Payer: Self-pay

## 2022-01-30 ENCOUNTER — Ambulatory Visit (HOSPITAL_COMMUNITY)
Admission: EM | Admit: 2022-01-30 | Discharge: 2022-01-30 | Disposition: A | Discharge status: Home / Self Care | Payer: Self-pay

## 2022-01-30 ENCOUNTER — Other Ambulatory Visit: Payer: Self-pay

## 2022-01-30 ENCOUNTER — Encounter (HOSPITAL_COMMUNITY): Payer: Self-pay | Admitting: Emergency Medicine

## 2022-01-30 DIAGNOSIS — E1165 Type 2 diabetes mellitus with hyperglycemia: Secondary | ICD-10-CM | POA: Insufficient documentation

## 2022-01-30 DIAGNOSIS — R55 Syncope and collapse: Secondary | ICD-10-CM | POA: Insufficient documentation

## 2022-01-30 DIAGNOSIS — Z7984 Long term (current) use of oral hypoglycemic drugs: Secondary | ICD-10-CM | POA: Insufficient documentation

## 2022-01-30 DIAGNOSIS — Z79899 Other long term (current) drug therapy: Secondary | ICD-10-CM | POA: Insufficient documentation

## 2022-01-30 DIAGNOSIS — S01511A Laceration without foreign body of lip, initial encounter: Secondary | ICD-10-CM | POA: Insufficient documentation

## 2022-01-30 DIAGNOSIS — R0789 Other chest pain: Secondary | ICD-10-CM | POA: Insufficient documentation

## 2022-01-30 DIAGNOSIS — S022XXA Fracture of nasal bones, initial encounter for closed fracture: Secondary | ICD-10-CM | POA: Insufficient documentation

## 2022-01-30 DIAGNOSIS — R519 Headache, unspecified: Secondary | ICD-10-CM | POA: Insufficient documentation

## 2022-01-30 DIAGNOSIS — I10 Essential (primary) hypertension: Secondary | ICD-10-CM | POA: Insufficient documentation

## 2022-01-30 NOTE — Discharge Instructions (Signed)
Go to emergency department for treatment ?

## 2022-01-30 NOTE — ED Notes (Signed)
Notified erin, pa of patient .   °
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
## 2022-01-31 ENCOUNTER — Emergency Department (HOSPITAL_BASED_OUTPATIENT_CLINIC_OR_DEPARTMENT_OTHER)
Admission: EM | Admit: 2022-01-31 | Discharge: 2022-01-31 | Disposition: A | Discharge status: Home / Self Care | Payer: Self-pay | Attending: Emergency Medicine | Admitting: Emergency Medicine

## 2022-01-31 ENCOUNTER — Emergency Department (HOSPITAL_BASED_OUTPATIENT_CLINIC_OR_DEPARTMENT_OTHER): Payer: Self-pay | Admitting: Radiology

## 2022-01-31 ENCOUNTER — Emergency Department (HOSPITAL_BASED_OUTPATIENT_CLINIC_OR_DEPARTMENT_OTHER): Payer: Self-pay

## 2022-01-31 DIAGNOSIS — S20211A Contusion of right front wall of thorax, initial encounter: Secondary | ICD-10-CM

## 2022-01-31 DIAGNOSIS — S0990XA Unspecified injury of head, initial encounter: Secondary | ICD-10-CM

## 2022-01-31 DIAGNOSIS — S0083XA Contusion of other part of head, initial encounter: Secondary | ICD-10-CM

## 2022-01-31 DIAGNOSIS — S022XXA Fracture of nasal bones, initial encounter for closed fracture: Secondary | ICD-10-CM

## 2022-01-31 LAB — CBG MONITORING, ED: Glucose-Capillary: 158 mg/dL — ABNORMAL HIGH (ref 70–99)
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
## 2022-01-31 NOTE — ED Notes (Signed)
Pt ambulatory from waiting room at this time with steady gait; no obvious distress however pt observed to have frequent sniffing.  ?

## 2022-01-31 NOTE — Discharge Instructions (Signed)
Apply ice to swollen areas for 20 minutes every 2 hours while awake for the next 2 days. ? ?Take ibuprofen 600 mg every 6 hours as needed for pain. ? ?Follow-up with primary doctor or return to the ER if you experience additional problems. ?
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
## 2023-12-11 ENCOUNTER — Ambulatory Visit (HOSPITAL_COMMUNITY): Payer: Self-pay | Attending: Emergency Medicine

## 2023-12-11 ENCOUNTER — Other Ambulatory Visit: Payer: Self-pay

## 2023-12-11 ENCOUNTER — Emergency Department (HOSPITAL_COMMUNITY): Payer: Self-pay

## 2023-12-11 ENCOUNTER — Encounter (HOSPITAL_COMMUNITY): Payer: Self-pay | Admitting: Emergency Medicine

## 2023-12-11 ENCOUNTER — Emergency Department (HOSPITAL_COMMUNITY)
Admission: EM | Admit: 2023-12-11 | Discharge: 2023-12-11 | Disposition: A | Discharge status: Home / Self Care | Payer: Self-pay | Attending: Emergency Medicine | Admitting: Emergency Medicine

## 2023-12-11 DIAGNOSIS — M7989 Other specified soft tissue disorders: Secondary | ICD-10-CM | POA: Insufficient documentation

## 2023-12-11 DIAGNOSIS — R600 Localized edema: Secondary | ICD-10-CM | POA: Insufficient documentation

## 2023-12-11 DIAGNOSIS — R079 Chest pain, unspecified: Secondary | ICD-10-CM | POA: Insufficient documentation

## 2023-12-11 DIAGNOSIS — R739 Hyperglycemia, unspecified: Secondary | ICD-10-CM | POA: Insufficient documentation

## 2023-12-11 DIAGNOSIS — Z7984 Long term (current) use of oral hypoglycemic drugs: Secondary | ICD-10-CM | POA: Insufficient documentation

## 2023-12-11 LAB — CBC
HCT: 38.5 % — ABNORMAL LOW (ref 39.0–52.0)
Hemoglobin: 12.8 g/dL — ABNORMAL LOW (ref 13.0–17.0)
MCH: 28.5 pg (ref 26.0–34.0)
MCHC: 33.2 g/dL (ref 30.0–36.0)
MCV: 85.7 fL (ref 80.0–100.0)
Platelets: 265 10*3/uL (ref 150–400)
RBC: 4.49 MIL/uL (ref 4.22–5.81)
RDW: 11.9 % (ref 11.5–15.5)
WBC: 5.5 10*3/uL (ref 4.0–10.5)
nRBC: 0 % (ref 0.0–0.2)

## 2023-12-11 LAB — BASIC METABOLIC PANEL
Anion gap: 9 (ref 5–15)
BUN: 12 mg/dL (ref 6–20)
CO2: 26 mmol/L (ref 22–32)
Calcium: 9.1 mg/dL (ref 8.9–10.3)
Chloride: 102 mmol/L (ref 98–111)
Creatinine, Ser: 0.93 mg/dL (ref 0.61–1.24)
GFR, Estimated: >60 mL/min (ref >60)
Glucose, Bld: 143 mg/dL — ABNORMAL HIGH (ref 70–99)
Potassium: 3.8 mmol/L (ref 3.5–5.1)
Sodium: 137 mmol/L (ref 135–145)

## 2023-12-11 LAB — CBG MONITORING, ED: Glucose-Capillary: 140 mg/dL — ABNORMAL HIGH (ref 70–99)

## 2023-12-11 LAB — BRAIN NATRIURETIC PEPTIDE: B Natriuretic Peptide: 25.5 pg/mL (ref 0.0–100.0)

## 2023-12-11 LAB — TROPONIN I (HIGH SENSITIVITY)
Troponin I (High Sensitivity): 4 ng/L (ref <18)
Troponin I (High Sensitivity): 4 ng/L (ref <18)

## 2023-12-11 NOTE — ED Notes (Signed)
Patient ambulated to the restroom independently  

## 2023-12-11 NOTE — ED Triage Notes (Addendum)
 Patient reports non radiating left side chest pain x1 month. Patient reports left leg swelling for 3 months and now right leg is swollen. Denies shortness of breath. No cardiac history reported.

## 2023-12-11 NOTE — Discharge Instructions (Signed)
Contact a health care provider if: You have a fever. You have swelling in only one leg. You have increased swelling, redness, or pain in one or both of your legs. You have drainage or sores at the area where you have edema. Get help right away if: You have edema that starts suddenly or is getting worse, especially if you are pregnant or have a medical condition. You develop shortness of breath, especially when you are lying down. You have pain in your chest or abdomen. You feel weak. You feel like you will faint. These symptoms may be an emergency. Get help right away. Call 911. Do not wait to see if the symptoms will go away. Do not drive yourself to the hospital.
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
## 2023-12-12 ENCOUNTER — Ambulatory Visit (HOSPITAL_COMMUNITY): Admission: RE | Admit: 2023-12-12 | Payer: Self-pay | Source: Ambulatory Visit

## 2024-01-12 ENCOUNTER — Ambulatory Visit (HOSPITAL_COMMUNITY): Admission: RE | Admit: 2024-01-12 | Payer: Self-pay | Source: Ambulatory Visit

## 2024-05-07 DIAGNOSIS — R079 Chest pain, unspecified: Secondary | ICD-10-CM | POA: Insufficient documentation

## 2024-05-08 ENCOUNTER — Emergency Department (HOSPITAL_COMMUNITY)
Admission: EM | Admit: 2024-05-08 | Discharge: 2024-05-08 | Disposition: A | Discharge status: Home / Self Care | Payer: Self-pay | Attending: Emergency Medicine | Admitting: Emergency Medicine

## 2024-05-08 ENCOUNTER — Emergency Department (HOSPITAL_COMMUNITY): Payer: Self-pay

## 2024-05-08 DIAGNOSIS — R079 Chest pain, unspecified: Secondary | ICD-10-CM

## 2024-05-08 HISTORY — DX: Type 2 diabetes mellitus without complications: E11.9

## 2024-05-08 HISTORY — DX: Essential (primary) hypertension: I10

## 2024-05-08 MED ORDER — KETOROLAC TROMETHAMINE 15 MG/ML IJ SOLN
15.0000 mg | Freq: Once | INTRAMUSCULAR | Status: AC
  Administered 2024-05-08: 15 mg via INTRAVENOUS
  Filled 2024-05-08: qty 1

## 2024-05-09 ENCOUNTER — Encounter (HOSPITAL_COMMUNITY): Payer: Self-pay | Admitting: Emergency Medicine

## 2024-08-18 ENCOUNTER — Telehealth: Payer: Self-pay | Admitting: Nurse Practitioner

## 2024-08-18 ENCOUNTER — Other Ambulatory Visit: Payer: Self-pay

## 2024-08-18 DIAGNOSIS — E782 Mixed hyperlipidemia: Secondary | ICD-10-CM

## 2024-08-18 DIAGNOSIS — E119 Type 2 diabetes mellitus without complications: Secondary | ICD-10-CM

## 2024-08-18 DIAGNOSIS — N529 Male erectile dysfunction, unspecified: Secondary | ICD-10-CM

## 2024-08-18 DIAGNOSIS — E1140 Type 2 diabetes mellitus with diabetic neuropathy, unspecified: Secondary | ICD-10-CM

## 2024-08-18 DIAGNOSIS — I10 Essential (primary) hypertension: Secondary | ICD-10-CM

## 2024-08-18 DIAGNOSIS — G8929 Other chronic pain: Secondary | ICD-10-CM

## 2024-08-18 MED ORDER — PIOGLITAZONE HCL 15 MG PO TABS
15.0000 mg | ORAL_TABLET | Freq: Every day | ORAL | 0 refills | Status: DC
  Filled 2024-08-18: qty 30, 30d supply, fill #0

## 2024-08-18 MED ORDER — IBUPROFEN 800 MG PO TABS
800.0000 mg | ORAL_TABLET | Freq: Three times a day (TID) | ORAL | 0 refills | Status: DC | PRN
  Filled 2024-08-18: qty 30, 10d supply, fill #0

## 2024-08-18 MED ORDER — GLIPIZIDE 5 MG PO TABS
5.0000 mg | ORAL_TABLET | Freq: Two times a day (BID) | ORAL | 0 refills | Status: DC
  Filled 2024-08-18: qty 60, 30d supply, fill #0

## 2024-08-18 MED ORDER — METFORMIN HCL 1000 MG PO TABS
1000.0000 mg | ORAL_TABLET | Freq: Two times a day (BID) | ORAL | 0 refills | Status: DC
  Filled 2024-08-18: qty 60, 30d supply, fill #0

## 2024-08-18 MED ORDER — CYCLOBENZAPRINE HCL 10 MG PO TABS
10.0000 mg | ORAL_TABLET | Freq: Every evening | ORAL | 0 refills | Status: DC | PRN
  Filled 2024-08-18: qty 30, 30d supply, fill #0

## 2024-08-18 MED ORDER — PRAVASTATIN SODIUM 10 MG PO TABS
10.0000 mg | ORAL_TABLET | Freq: Every day | ORAL | 0 refills | Status: DC
  Filled 2024-08-18: qty 30, 30d supply, fill #0

## 2024-08-18 MED ORDER — LISINOPRIL 20 MG PO TABS
20.0000 mg | ORAL_TABLET | Freq: Every day | ORAL | 0 refills | Status: DC
  Filled 2024-08-18: qty 30, 30d supply, fill #0

## 2024-08-18 MED ORDER — SILDENAFIL CITRATE 100 MG PO TABS
50.0000 mg | ORAL_TABLET | Freq: Every day | ORAL | 0 refills | Status: DC | PRN
  Filled 2024-08-18: qty 10, 30d supply, fill #0

## 2024-08-18 MED ORDER — GABAPENTIN 300 MG PO CAPS
300.0000 mg | ORAL_CAPSULE | Freq: Three times a day (TID) | ORAL | 0 refills | Status: DC
  Filled 2024-08-18: qty 90, 30d supply, fill #0

## 2024-08-18 NOTE — Progress Notes (Signed)
 Acute Video Visit    Virtual Visit Consent:   Chase Mccormick, you are scheduled for a virtual visit with a  provider today.     Just as with appointments in the office, your consent must be obtained to participate.  Your consent will be active for this visit and any virtual visit you may have with one of our providers in the next 365 days.     If you have a MyChart account, a copy of this consent can be sent to you electronically.  All virtual visits are billed to your insurance company just like a traditional visit in the office.    If the connection with a video visit is poor, the visit may have to be switched to a telephone visit.  With either a video or telephone visit, we are not always able to ensure that we have a secure connection.     I need to obtain your verbal consent now.   Are you willing to proceed with your visit today?    Chase Mccormick has provided verbal consent on 08/18/2024 for a virtual visit (video or telephone).   [name], FNP  Date: 08/18/2024 3:24 PM  Subjective:     Patient ID: Chase Mccormick, male    DOB: 08/25/1966, 58 y.o.   MRN: [id number]  LILLETTE [name], connected with  Chase Mccormick  ([id number], 1966-09-26) on 08/18/24 at  3:40 PM EDT by a video-enabled telemedicine application and verified that I am speaking with the correct person using two identifiers.   Location: Patient: [name]  Provider: Virtual Visit Location Provider: Home Office   I discussed the limitations of evaluation and management by telemedicine and the availability of in person appointments. The patient expressed understanding and agreed to proceed.    No chief complaint on file.   HPI Chase Mccormick is a 58 y.o. who identifies as a male who was assigned male at birth, and is being seen today for request of medication refills  He is a patient at the [name] and has been under the care of [name], he is awaiting a new patient  appointment next month with [name].   Requesting a 30day refill of his medications.  His medical history is significant for DM2, neuropathy, HTN, chronic pain, hyperlipidemia and ED.   Denies any acute needs today   Receives care and SDOH needs at [name]       Objective:    Physical Exam Constitutional:      General: He is not in acute distress.    Appearance: Normal appearance. He is not ill-appearing.  HENT:     Nose: Nose normal.     Mouth/Throat:     Mouth: Mucous membranes are moist.  Pulmonary:     Effort: Pulmonary effort is normal.  Neurological:     Mental Status: He is alert and oriented to person, place, and time.  Psychiatric:        Mood and Affect: Mood normal.         Assessment & Plan:    Encouraged to use [facility] for acute needs while awaiting new patient appointment next month   1. Mixed hyperlipidemia (Primary)  - pravastatin  (PRAVACHOL ) 10 MG tablet; Take 1 tablet (10 mg total) by mouth daily.  Dispense: 30 tablet; Refill: 0  2. Benign essential HTN  - lisinopril  (ZESTRIL ) 20 MG tablet; Take 1 tablet (20 mg total) by mouth daily.  Dispense: 30 tablet; Refill: 0  3. Diabetes mellitus without complication (HCC)   4. Type 2 diabetes mellitus with diabetic neuropathy, without long-term current use of insulin (HCC)  - metFORMIN  (GLUCOPHAGE ) 1000 MG tablet; Take 1 tablet (1,000 mg total) by mouth 2 (two) times daily with a meal.  Dispense: 60 tablet; Refill: 0 - pioglitazone  (ACTOS ) 15 MG tablet; Take 1 tablet (15 mg total) by mouth daily.  Dispense: 30 tablet; Refill: 0 - glipiZIDE  (GLUCOTROL ) 5 MG tablet; Take 1 tablet (5 mg total) by mouth 2 (two) times daily before a meal.  Dispense: 60 tablet; Refill: 0 - gabapentin (NEURONTIN) 300 MG capsule; Take 1 capsule (300 mg total) by mouth 3 (three) times daily.  Dispense: 90 capsule; Refill: 0  5. Other chronic pain  - cyclobenzaprine  (FLEXERIL ) 10 MG tablet; Take 1 tablet by  mouth before bed as needed for muscle spasm. Warning: May cause drowsiness.  Dispense: 30 tablet; Refill: 0 - ibuprofen  (ADVIL ) 800 MG tablet; Take 1 tablet (800 mg total) by mouth every 8 (eight) hours as needed for moderate pain (pain score 4-6).  Dispense: 30 tablet; Refill: 0  6. Erectile dysfunction, unspecified erectile dysfunction type  - sildenafil (VIAGRA) 100 MG tablet; Take 0.5-1 tablets (50-100 mg total) by mouth daily as needed for erectile dysfunction.  Dispense: 30 tablet; Refill: 0   Follow Up Instructions: I discussed the assessment and treatment plan with the patient. The patient was provided an opportunity to ask questions and all were answered. The patient agreed with the plan and demonstrated an understanding of the instructions.  A copy of instructions were sent to the patient via MyChart unless otherwise noted below.     The patient was advised to call back or seek an in-person evaluation if the symptoms worsen or if the condition fails to improve as anticipated.    [name], FNP  **Disclaimer: This note may have been dictated with voice recognition software. Similar sounding words can inadvertently be transcribed and this note may contain transcription errors which may not have been corrected upon publication of note.**

## 2024-08-19 ENCOUNTER — Other Ambulatory Visit: Payer: Self-pay

## 2024-09-26 ENCOUNTER — Other Ambulatory Visit: Payer: Self-pay

## 2024-09-26 ENCOUNTER — Other Ambulatory Visit: Payer: Self-pay | Admitting: Nurse Practitioner

## 2024-09-26 DIAGNOSIS — G8929 Other chronic pain: Secondary | ICD-10-CM

## 2024-09-26 DIAGNOSIS — N529 Male erectile dysfunction, unspecified: Secondary | ICD-10-CM

## 2024-09-26 DIAGNOSIS — E782 Mixed hyperlipidemia: Secondary | ICD-10-CM

## 2024-09-26 DIAGNOSIS — I10 Essential (primary) hypertension: Secondary | ICD-10-CM

## 2024-09-26 DIAGNOSIS — E1140 Type 2 diabetes mellitus with diabetic neuropathy, unspecified: Secondary | ICD-10-CM

## 2024-09-26 MED ORDER — LISINOPRIL 20 MG PO TABS
20.0000 mg | ORAL_TABLET | Freq: Every day | ORAL | 0 refills | Status: AC
  Filled 2024-09-26: qty 30, 30d supply, fill #0

## 2024-09-26 MED ORDER — METFORMIN HCL 1000 MG PO TABS
1000.0000 mg | ORAL_TABLET | Freq: Two times a day (BID) | ORAL | 0 refills | Status: AC
  Filled 2024-09-26: qty 60, 30d supply, fill #0

## 2024-09-26 MED ORDER — IBUPROFEN 800 MG PO TABS
800.0000 mg | ORAL_TABLET | Freq: Three times a day (TID) | ORAL | 0 refills | Status: AC | PRN
  Filled 2024-09-26: qty 30, 10d supply, fill #0

## 2024-09-26 MED ORDER — SILDENAFIL CITRATE 100 MG PO TABS
50.0000 mg | ORAL_TABLET | Freq: Every day | ORAL | 0 refills | Status: AC | PRN
  Filled 2024-09-26: qty 10, 30d supply, fill #0

## 2024-09-26 MED ORDER — GLIPIZIDE 5 MG PO TABS
5.0000 mg | ORAL_TABLET | Freq: Two times a day (BID) | ORAL | 0 refills | Status: AC
  Filled 2024-09-26: qty 60, 30d supply, fill #0

## 2024-09-26 MED ORDER — PIOGLITAZONE HCL 15 MG PO TABS
15.0000 mg | ORAL_TABLET | Freq: Every day | ORAL | 0 refills | Status: AC
  Filled 2024-09-26: qty 30, 30d supply, fill #0

## 2024-09-26 MED ORDER — GABAPENTIN 300 MG PO CAPS
300.0000 mg | ORAL_CAPSULE | Freq: Three times a day (TID) | ORAL | 0 refills | Status: AC
  Filled 2024-09-26: qty 90, 30d supply, fill #0

## 2024-09-26 MED ORDER — CYCLOBENZAPRINE HCL 10 MG PO TABS
10.0000 mg | ORAL_TABLET | Freq: Every evening | ORAL | 0 refills | Status: AC | PRN
  Filled 2024-09-26: qty 30, 30d supply, fill #0

## 2024-09-26 MED ORDER — PRAVASTATIN SODIUM 10 MG PO TABS
10.0000 mg | ORAL_TABLET | Freq: Every day | ORAL | 0 refills | Status: AC
  Filled 2024-09-26: qty 30, 30d supply, fill #0

## 2024-09-26 NOTE — Progress Notes (Signed)
 Requesting medication refills awaiting PCP appointment this month at [facility]

## 2024-10-26 NOTE — Progress Notes (Addendum)
 Pt attended 08/18/2024 [facility] Appointment.  Per initial f/u pt was reached out via phone and was left vm. Pt called back and stated that he does not have any current SDOH needs but does have a PCP and has been recently seen in December 2025 and has an upcoming appt as well but could not recall the date.   Per chart review pt does have a PCP [facility]; [facility] - no [facility] visible visits), insurance (not in system), and is a smoker. Pt does not indicate any SDOH needs at this time.  No additional pt f/u to be scheduled at this time per health equity protocol.

## 2024-11-04 ENCOUNTER — Encounter (HOSPITAL_COMMUNITY): Payer: Self-pay

## 2024-11-04 ENCOUNTER — Emergency Department (HOSPITAL_COMMUNITY): Payer: Self-pay

## 2024-11-04 ENCOUNTER — Emergency Department (HOSPITAL_COMMUNITY)
Admission: EM | Admit: 2024-11-04 | Discharge: 2024-11-04 | Disposition: A | Discharge status: Home / Self Care | Payer: Self-pay | Attending: Emergency Medicine | Admitting: Emergency Medicine

## 2024-11-04 DIAGNOSIS — I10 Essential (primary) hypertension: Secondary | ICD-10-CM | POA: Insufficient documentation

## 2024-11-04 DIAGNOSIS — Z79899 Other long term (current) drug therapy: Secondary | ICD-10-CM | POA: Insufficient documentation

## 2024-11-04 DIAGNOSIS — M25512 Pain in left shoulder: Secondary | ICD-10-CM | POA: Insufficient documentation

## 2024-11-04 DIAGNOSIS — Z7984 Long term (current) use of oral hypoglycemic drugs: Secondary | ICD-10-CM | POA: Insufficient documentation

## 2024-11-04 DIAGNOSIS — E119 Type 2 diabetes mellitus without complications: Secondary | ICD-10-CM | POA: Insufficient documentation

## 2024-11-04 NOTE — ED Provider Notes (Signed)
 " Chase Mccormick AT [facility] Provider Note   CSN: [id number] Arrival date & time: 11/04/24  0036     Patient presents with: Arm Pain (left)   Chase Mccormick is a 59 y.o. male.   The history is provided by the patient.   Patient reports he has had left shoulder pain for several months, worse over the past several days.  Reports when he lays on that arm it hurts.  Reports fall several years ago but no recent trauma.  No chest pain or shortness of breath.  No weakness.  Pain is exacerbated by movement of his arm    Past Medical History:  Diagnosis Date   Diabetes mellitus    Diabetes mellitus without complication (HCC)    High cholesterol    Hypertension     Prior to Admission medications  Medication Sig Start Date End Date Taking? Authorizing Provider  cyclobenzaprine  (FLEXERIL ) 10 MG tablet Take 1 tablet by mouth before bed as needed for muscle spasm. Warning: May cause drowsiness. 09/26/24   Kennyth Domino, FNP  diclofenac  (VOLTAREN ) 75 MG EC tablet Take 1 tablet (75 mg total) by mouth 2 (two) times daily. 07/10/21   Rolinda Rogue, MD  gabapentin  (NEURONTIN ) 300 MG capsule Take 1 capsule (300 mg total) by mouth 3 (three) times daily. 09/26/24   Kennyth Domino, FNP  glipiZIDE  (GLUCOTROL ) 5 MG tablet Take 1 tablet (5 mg total) by mouth 2 (two) times daily before a meal. 09/26/24   Kennyth Domino, FNP  ibuprofen  (ADVIL ) 800 MG tablet Take 1 tablet (800 mg total) by mouth every 8 (eight) hours as needed for moderate pain (pain score 4-6). 09/26/24   Kennyth Domino, FNP  lisinopril  (ZESTRIL ) 20 MG tablet Take 1 tablet (20 mg total) by mouth daily. 09/26/24   Kennyth Domino, FNP  metFORMIN  (GLUCOPHAGE ) 1000 MG tablet Take 1 tablet (1,000 mg total) by mouth 2 (two) times daily with a meal. 09/26/24   Kennyth Domino, FNP  pioglitazone  (ACTOS ) 15 MG tablet Take 1 tablet (15 mg total) by mouth daily. 09/26/24   Kennyth Domino, FNP  pravastatin  (PRAVACHOL ) 10 MG tablet  Take 1 tablet (10 mg total) by mouth daily. 09/26/24   Kennyth Domino, FNP  sildenafil  (VIAGRA ) 100 MG tablet Take 0.5-1 tablets (50-100 mg total) by mouth daily as needed for erectile dysfunction. 09/26/24   Kennyth Domino, FNP  triamcinolone  (NASACORT ) 55 MCG/ACT AERO nasal inhaler Place 2 sprays into the nose daily. 10/20/19   Maranda Jamee Jacob, MD  hydrochlorothiazide  (HYDRODIURIL ) 25 MG tablet Take 0.5 tablets (12.5 mg total) by mouth daily. 08/01/18 03/03/20  Lorriane Holmes, MD    Allergies: Patient has no known allergies.    Review of Systems  Respiratory:  Negative for shortness of breath.   Cardiovascular:  Negative for chest pain.  Musculoskeletal:  Positive for arthralgias. Negative for joint swelling and neck pain.  Neurological:  Negative for weakness and numbness.    Updated Vital Signs BP (!) 166/82 (BP Location: Left Arm)   Pulse (!) 52   Temp 97.7 F (36.5 C) (Oral)   Resp 19   SpO2 100%   Physical Exam CONSTITUTIONAL: Well developed/well nourished, no distress HEAD: Normocephalic/atraumatic Spine - no c-spine tenderness NEURO: Pt is awake/alert/appropriate, moves all extremitiesx4.  No facial droop.   Equal power (5/5) with hand grip, wrist flex/extension, elbow flex/extension, and equal power with shoulder abduction/adduction.  No focal sensory deficit to light touch is noted in either UE.  EXTREMITIES: pulses normal/equal in both upper extremities, full ROM Tenderness to palpation of the anterior left shoulder.  No erythema, no crepitance, no warmth No deformities Patient is able to range the left arm but does elicit pain with movement above his head SKIN: warm, color normal  (all labs ordered are listed, but only abnormal results are displayed) Labs Reviewed - No data to display  EKG: None  Radiology: DG Shoulder Left Port Result Date: 11/04/2024 EXAM: 1 VIEW(S) XRAY OF THE SHOULDER 11/04/2024 01:22:00 AM COMPARISON: None available. CLINICAL HISTORY: Pain  FINDINGS: BONES AND JOINTS: Glenohumeral joint is normally aligned. No acute fracture. No malalignment. Minimal degenerative changes of AC joint. SOFT TISSUES: No abnormal calcifications. Visualized lung is unremarkable. IMPRESSION: 1. No acute osseous abnormality. Electronically signed by: Morgane Naveau MD 11/04/2024 01:26 AM EST RP Workstation: [id number]     Procedures   Medications Ordered in the ED - No data to display                                  Medical Decision Making Amount and/or Complexity of Data Reviewed Radiology: ordered.   Patient presents with left arm pain for several months but worse over the past several days. Differential includes arthritis, soft tissue injury, occult fracture, dislocation, unstable angina, cervical radiculopathy, DVT  Patient is able to range the arm without difficulty but does have pain with movement. This appears to be more of a soft tissue injury, no signs of any bony injury.  No signs of DVT or any infectious etiology He will be referred to orthopedics Patient reports he already has pain meds at home including muscle relaxants     Final diagnoses:  Acute pain of left shoulder    ED Discharge Orders     None          Midge Golas, MD 11/04/24 [phone]  "

## 2024-11-04 NOTE — ED Triage Notes (Signed)
 Patient reports pain in the left arm whenever he tries to lift the arm. Ongoing for 3 months. States he took ibuprofen  with no relief. States he feels the pain whenever he wakes up after laying on the arm. States 4 years ago he fell and the arm has been giving him pain ever since. States the pain starts at his shoulder and back.

## 2024-11-04 NOTE — ED Triage Notes (Signed)
 Quick triage note: Pt to ED c/o left arm pain x 3 months, progressively getting worse. No known recent injury
